# Patient Record
Sex: Female | Born: 1980
Health system: Southern US, Community
[De-identification: ages and names within clinical notes are randomized; demographics above are authoritative.]

## PROBLEM LIST (undated history)

## (undated) DIAGNOSIS — J45909 Unspecified asthma, uncomplicated: Secondary | ICD-10-CM

## (undated) DIAGNOSIS — F909 Attention-deficit hyperactivity disorder, unspecified type: Secondary | ICD-10-CM

## (undated) DIAGNOSIS — F329 Major depressive disorder, single episode, unspecified: Secondary | ICD-10-CM

## (undated) DIAGNOSIS — Z6841 Body Mass Index (BMI) 40.0 and over, adult: Secondary | ICD-10-CM

## (undated) DIAGNOSIS — F32A Depression, unspecified: Secondary | ICD-10-CM

## (undated) DIAGNOSIS — I10 Essential (primary) hypertension: Secondary | ICD-10-CM

## (undated) HISTORY — DX: Major depressive disorder, single episode, unspecified: F32.9

## (undated) HISTORY — DX: Depression, unspecified: F32.A

## (undated) HISTORY — DX: Body Mass Index (BMI) 40.0 and over, adult: Z684

## (undated) HISTORY — DX: Attention-deficit hyperactivity disorder, unspecified type: F90.9

## (undated) HISTORY — DX: Morbid (severe) obesity due to excess calories: E66.01

---
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
## 2005-04-04 ENCOUNTER — Inpatient Hospital Stay (HOSPITAL_COMMUNITY): Admission: AD | Admit: 2005-04-04 | Discharge: 2005-04-04 | Payer: Self-pay | Admitting: *Deleted

## 2005-10-09 HISTORY — PX: CHOLECYSTECTOMY: SHX55

## 2005-10-09 HISTORY — PX: TUBAL LIGATION: SHX77

## 2009-07-07 ENCOUNTER — Emergency Department (HOSPITAL_COMMUNITY): Admission: EM | Admit: 2009-07-07 | Discharge: 2009-07-07 | Payer: Self-pay | Admitting: Emergency Medicine

## 2010-01-13 ENCOUNTER — Emergency Department (HOSPITAL_COMMUNITY): Admission: EM | Admit: 2010-01-13 | Discharge: 2010-01-13 | Payer: Self-pay | Admitting: Emergency Medicine

## 2010-01-25 ENCOUNTER — Emergency Department (HOSPITAL_COMMUNITY): Admission: EM | Admit: 2010-01-25 | Discharge: 2010-01-25 | Payer: Self-pay | Admitting: Emergency Medicine

## 2010-02-16 ENCOUNTER — Emergency Department (HOSPITAL_COMMUNITY): Admission: EM | Admit: 2010-02-16 | Discharge: 2010-02-17 | Payer: Self-pay | Admitting: Emergency Medicine

## 2010-02-20 ENCOUNTER — Emergency Department (HOSPITAL_COMMUNITY): Admission: EM | Admit: 2010-02-20 | Discharge: 2010-02-20 | Payer: Self-pay | Admitting: Emergency Medicine

## 2010-02-28 ENCOUNTER — Ambulatory Visit: Payer: Self-pay | Admitting: Internal Medicine

## 2011-01-13 LAB — URINALYSIS, ROUTINE W REFLEX MICROSCOPIC
Bilirubin Urine: NEGATIVE
Glucose, UA: NEGATIVE mg/dL
Ketones, ur: NEGATIVE mg/dL
Nitrite: NEGATIVE
Protein, ur: NEGATIVE mg/dL
Specific Gravity, Urine: 1.012 (ref 1.005–1.030)
Urobilinogen, UA: 0.2 mg/dL (ref 0.0–1.0)
pH: 6.5 (ref 5.0–8.0)

## 2011-01-13 LAB — URINE MICROSCOPIC-ADD ON

## 2011-01-13 LAB — URINE CULTURE: Colony Count: >=100000

## 2011-01-13 LAB — POCT I-STAT, CHEM 8
BUN: 8 mg/dL (ref 6–23)
Calcium, Ion: 1.09 mmol/L — ABNORMAL LOW (ref 1.12–1.32)
Chloride: 106 meq/L (ref 96–112)
Creatinine, Ser: 1 mg/dL (ref 0.4–1.2)
Glucose, Bld: 95 mg/dL (ref 70–99)
HCT: 35 % — ABNORMAL LOW (ref 36.0–46.0)
Hemoglobin: 11.9 g/dL — ABNORMAL LOW (ref 12.0–15.0)
Potassium: 3.8 meq/L (ref 3.5–5.1)
Sodium: 137 meq/L (ref 135–145)
TCO2: 22 mmol/L (ref 0–100)

## 2011-01-13 LAB — DIFFERENTIAL
Basophils Absolute: 0 K/uL (ref 0.0–0.1)
Basophils Relative: 0 % (ref 0–1)
Eosinophils Absolute: 0.3 K/uL (ref 0.0–0.7)
Eosinophils Relative: 3 % (ref 0–5)
Lymphocytes Relative: 18 % (ref 12–46)
Lymphs Abs: 1.5 K/uL (ref 0.7–4.0)
Monocytes Absolute: 0.5 K/uL (ref 0.1–1.0)
Monocytes Relative: 6 % (ref 3–12)
Neutro Abs: 6.1 K/uL (ref 1.7–7.7)
Neutrophils Relative %: 72 % (ref 43–77)

## 2011-01-13 LAB — CBC
HCT: 34.1 % — ABNORMAL LOW (ref 36.0–46.0)
Hemoglobin: 11.5 g/dL — ABNORMAL LOW (ref 12.0–15.0)
MCHC: 33.7 g/dL (ref 30.0–36.0)
MCV: 87.8 fL (ref 78.0–100.0)
Platelets: 264 K/uL (ref 150–400)
RBC: 3.88 MIL/uL (ref 3.87–5.11)
RDW: 14.6 % (ref 11.5–15.5)
WBC: 8.5 K/uL (ref 4.0–10.5)

## 2011-01-13 LAB — POCT CARDIAC MARKERS
Myoglobin, poc: 48.2 ng/mL (ref 12–200)
Troponin i, poc: <0.05 ng/mL (ref 0.00–0.09)

## 2011-01-13 LAB — PREGNANCY, URINE: Preg Test, Ur: NEGATIVE

## 2011-10-10 HISTORY — PX: KIDNEY STONE SURGERY: SHX686

## 2018-01-04 ENCOUNTER — Other Ambulatory Visit: Payer: Self-pay

## 2018-01-04 ENCOUNTER — Emergency Department (HOSPITAL_COMMUNITY)
Admission: EM | Admit: 2018-01-04 | Discharge: 2018-01-04 | Disposition: A | Discharge status: Home / Self Care | Payer: Self-pay | Attending: Physician Assistant | Admitting: Physician Assistant

## 2018-01-04 ENCOUNTER — Encounter (HOSPITAL_COMMUNITY): Payer: Self-pay

## 2018-01-04 DIAGNOSIS — M79671 Pain in right foot: Secondary | ICD-10-CM | POA: Insufficient documentation

## 2018-01-04 DIAGNOSIS — M79672 Pain in left foot: Secondary | ICD-10-CM | POA: Insufficient documentation

## 2018-01-04 DIAGNOSIS — Z5321 Procedure and treatment not carried out due to patient leaving prior to being seen by health care provider: Secondary | ICD-10-CM | POA: Insufficient documentation

## 2018-01-04 HISTORY — DX: Unspecified asthma, uncomplicated: J45.909

## 2018-01-04 HISTORY — DX: Essential (primary) hypertension: I10

## 2018-01-04 LAB — COMPREHENSIVE METABOLIC PANEL
ALBUMIN: 3.5 g/dL (ref 3.5–5.0)
ALK PHOS: 65 U/L (ref 38–126)
ALT: 29 U/L (ref 14–54)
AST: 23 U/L (ref 15–41)
Anion gap: 8 (ref 5–15)
BUN: 5 mg/dL — ABNORMAL LOW (ref 6–20)
CALCIUM: 8.9 mg/dL (ref 8.9–10.3)
CHLORIDE: 107 mmol/L (ref 101–111)
CO2: 23 mmol/L (ref 22–32)
Creatinine, Ser: 0.69 mg/dL (ref 0.44–1.00)
GFR calc Af Amer: >60 mL/min (ref >60)
GFR calc non Af Amer: >60 mL/min (ref >60)
GLUCOSE: 83 mg/dL (ref 65–99)
Potassium: 3.3 mmol/L — ABNORMAL LOW (ref 3.5–5.1)
SODIUM: 138 mmol/L (ref 135–145)
Total Bilirubin: 0.6 mg/dL (ref 0.3–1.2)
Total Protein: 7 g/dL (ref 6.5–8.1)

## 2018-01-04 LAB — CBC
HCT: 34.4 % — ABNORMAL LOW (ref 36.0–46.0)
HEMOGLOBIN: 10.4 g/dL — AB (ref 12.0–15.0)
MCH: 24 pg — ABNORMAL LOW (ref 26.0–34.0)
MCHC: 30.2 g/dL (ref 30.0–36.0)
MCV: 79.4 fL (ref 78.0–100.0)
PLATELETS: 465 10*3/uL — AB (ref 150–400)
RBC: 4.33 MIL/uL (ref 3.87–5.11)
RDW: 16.9 % — ABNORMAL HIGH (ref 11.5–15.5)
WBC: 6.8 10*3/uL (ref 4.0–10.5)

## 2018-01-04 NOTE — ED Provider Notes (Signed)
Patient placed in Quick Look pathway, seen and evaluated   Chief Complaint: bilateral foot pain  HPI:   Emily Salazar is a 37 y.o. female with hx of HTN who presents to the ED with bilateral foot pain that started 3 days ago. Patient reports working long hours standing. She states that for the past 3 days she has been standing from 8 am till 10:30 pm. Patient wears waterproof boots without much arch support. Patient's BP elevated but she reports not taking her BP medication today.   ROS: M/S: bilateral foot pain  Physical Exam:  BP (!) 191/124 (BP Location: Right Arm)   Pulse 90   Temp 98.7 F (37.1 C) (Oral)   Resp 16   LMP 12/19/2017 (Within Days)   SpO2 100%    Gen: No distress  Neuro: Awake and Alert  Skin: Warm and dry  M/S: tenderness to the plantar aspect of both feet.     Focused Exam:    Initiation of care has begun. The patient has been counseled on the process, plan, and necessity for staying for the completion/evaluation, and the remainder of the medical screening examination    Janne Napoleoneese, Hope M, NP 01/04/18 1448    Abelino DerrickMackuen, Courteney Lyn, MD 01/04/18 2003

## 2018-01-04 NOTE — ED Triage Notes (Signed)
Pt reports bilateral foot pain X3 days. Pt states she has been having to take tylenol and ibuprofen to control the pain while at work. Pt alert and oriented, tearful in triage. States she stands all day at work. Pt denies swelling.

## 2018-01-04 NOTE — ED Notes (Signed)
Pt stated that she did not want to wait any longer and she was leaving

## 2018-03-01 ENCOUNTER — Encounter: Payer: Self-pay | Admitting: Nurse Practitioner

## 2018-03-01 ENCOUNTER — Ambulatory Visit (INDEPENDENT_AMBULATORY_CARE_PROVIDER_SITE_OTHER): Payer: PRIVATE HEALTH INSURANCE

## 2018-03-01 ENCOUNTER — Ambulatory Visit (INDEPENDENT_AMBULATORY_CARE_PROVIDER_SITE_OTHER): Payer: PRIVATE HEALTH INSURANCE | Admitting: Nurse Practitioner

## 2018-03-01 VITALS — BP 178/120 | HR 95 | Temp 99.1°F | Ht 62.0 in | Wt 238.0 lb

## 2018-03-01 DIAGNOSIS — R0602 Shortness of breath: Secondary | ICD-10-CM

## 2018-03-01 DIAGNOSIS — M778 Other enthesopathies, not elsewhere classified: Secondary | ICD-10-CM

## 2018-03-01 DIAGNOSIS — R29898 Other symptoms and signs involving the musculoskeletal system: Secondary | ICD-10-CM | POA: Diagnosis not present

## 2018-03-01 DIAGNOSIS — J9801 Acute bronchospasm: Secondary | ICD-10-CM | POA: Diagnosis not present

## 2018-03-01 DIAGNOSIS — I10 Essential (primary) hypertension: Secondary | ICD-10-CM

## 2018-03-01 DIAGNOSIS — J4541 Moderate persistent asthma with (acute) exacerbation: Secondary | ICD-10-CM | POA: Diagnosis not present

## 2018-03-01 DIAGNOSIS — B351 Tinea unguium: Secondary | ICD-10-CM

## 2018-03-01 MED ORDER — ALBUTEROL SULFATE HFA 108 (90 BASE) MCG/ACT IN AERS: 1.0000 | INHALATION_SPRAY | Freq: Four times a day (QID) | RESPIRATORY_TRACT | 0 refills | Status: DC | PRN

## 2018-03-01 MED ORDER — LISINOPRIL 10 MG PO TABS: 10.0000 mg | ORAL_TABLET | Freq: Every day | ORAL | 1 refills | Status: DC

## 2018-03-01 MED ORDER — IPRATROPIUM-ALBUTEROL 0.5-2.5 (3) MG/3ML IN SOLN
3.0000 mL | Freq: Four times a day (QID) | RESPIRATORY_TRACT | Status: DC
  Administered 2018-03-01: 3 mL via RESPIRATORY_TRACT

## 2018-03-01 MED ORDER — AMLODIPINE BESYLATE 5 MG PO TABS: 5.0000 mg | ORAL_TABLET | Freq: Every day | ORAL | 1 refills | Status: DC

## 2018-03-01 MED ORDER — FLUTICASONE-SALMETEROL 115-21 MCG/ACT IN AERO: 2.0000 | INHALATION_SPRAY | Freq: Two times a day (BID) | RESPIRATORY_TRACT | 5 refills | Status: DC

## 2018-03-01 MED ORDER — MELOXICAM 7.5 MG PO TABS: 7.5000 mg | ORAL_TABLET | Freq: Every day | ORAL | 0 refills | Status: DC

## 2018-03-01 NOTE — Patient Instructions (Addendum)
Normal CXR  Start advair inhaler (rinse mouth after each use)  You will be contacted to schedule appt with hand specialist and podiatry.  Stop use of ASA, naproxen, aleve, ibuprofen or advil.  You can on use tylenol  1tab every 4hrs as needed.

## 2018-03-01 NOTE — Progress Notes (Signed)
Subjective:  Patient ID: Emily Salazar, female    DOB: [birth year]  Age: 37 y.o. MRN: [id number]  CC: Establish Care (est care/both hand numbness and swelling--going on for a while--was on steroid--went to urgent for it--steriod didnt help/rright wrist pain--1 mo--inhalter?)   Wrist Pain   The pain is present in the left wrist and right wrist. This is a chronic problem. The current episode started more than 1 year ago. There has been no history of extremity trauma. The problem occurs constantly. The problem has been gradually worsening. The quality of the pain is described as aching. The pain is at a severity of 9/10. Associated symptoms include joint swelling, a limited range of motion, numbness, stiffness and tingling. Pertinent negatives include no fever, inability to bear weight or joint locking. The symptoms are aggravated by activity. She has tried acetaminophen, OTC ointments, NSAIDS and oral narcotics (and oral prednisone) for the symptoms. The treatment provided mild relief. Her past medical history is significant for osteoarthritis.  Cough  This is a chronic problem. The current episode started more than 1 year ago. The problem has been waxing and waning. The problem occurs constantly. The cough is productive of sputum. Associated symptoms include nasal congestion, postnasal drip, shortness of breath and wheezing. Pertinent negatives include no chest pain, chills, ear congestion, ear pain, fever, headaches, heartburn, hemoptysis, myalgias, rash, rhinorrhea, sore throat, sweats or weight loss. The symptoms are aggravated by lying down, cold air, pollens and fumes. Risk factors for lung disease include smoking/tobacco exposure. She has tried a beta-agonist inhaler and OTC cough suppressant for the symptoms. The treatment provided mild relief. Her past medical history is significant for asthma and bronchitis.  Hypertension  This is a chronic problem. The current episode started more than 1 year ago.  The problem has been gradually worsening since onset. The problem is uncontrolled. Associated symptoms include shortness of breath. Pertinent negatives include no anxiety, blurred vision, chest pain, headaches, orthopnea, palpitations, peripheral edema, PND or sweats. Agents associated with hypertension include NSAIDs. Risk factors for coronary artery disease include family history, obesity and smoking/tobacco exposure. Past treatments include ACE inhibitors, diuretics and calcium channel blockers. The current treatment provides moderate improvement. Compliance problems include psychosocial issues.   out of BP medication since release from prison 07/2017.  Cough x several years Productive (clear) SOB Hx of asthma, uncontrolled Use of albuterol several times a day. Peak flow measurement 290, 240, 297ml/min  Hand and wrist pain and numbness: Chronic, worsening. Job prior to imprisonment: receptionist Use of wrist brace with minimal relief. No relief with tylenol and aleve and tylenol. Diagnosed with carpal tunnel syndrome while in prison.  Outpatient Medications Prior to Visit  Medication Sig Dispense Refill  . Acetaminophen (TYLENOL 8 HOUR PO) Take by mouth.    . IBUPROFEN PO Take by mouth.    . NON FORMULARY OTC for allergy    . ALBUTEROL SULFATE IN Inhale into the lungs.     No facility-administered medications prior to visit.    Reviewed family and social hx with patient.  ROS See HPI  Objective:  BP (!) 178/120   Pulse 95   Temp 99.1 F (37.3 C) (Oral)   Ht  (1.575 m)   Wt 238 lb (108 kg)   SpO2 99%   BMI 43.53 kg/m   BP Readings from Last 3 Encounters:  03/01/18 (!) 178/120  01/04/18 (!) 163/109    Wt Readings from Last 3 Encounters:  03/01/18 238 lb (108  kg)    Physical Exam  Constitutional: She is oriented to person, place, and time. No distress.  HENT:  Right Ear: External ear normal.  Left Ear: External ear normal.  Nose: Nose normal.    Mouth/Throat: Oropharynx is clear and moist. No oropharyngeal exudate.  Neck: Normal range of motion. Neck supple. No thyromegaly present.  Cardiovascular: Normal rate, regular rhythm, normal heart sounds and intact distal pulses.  Pulmonary/Chest: Effort normal. No respiratory distress. She has wheezes. She has rales.  Musculoskeletal: She exhibits tenderness.       Right shoulder: Normal.       Left shoulder: Normal.       Right wrist: She exhibits decreased range of motion, tenderness and swelling.       Left wrist: She exhibits decreased range of motion, tenderness and swelling.       Cervical back: Normal.       Right hand: Normal sensation noted. Decreased strength noted. She exhibits finger abduction, thumb/finger opposition and wrist extension trouble.       Left hand: She exhibits normal range of motion, no tenderness, no laceration and no swelling. Normal sensation noted. Decreased strength noted. She exhibits finger abduction, thumb/finger opposition and wrist extension trouble.  Lymphadenopathy:    She has no cervical adenopathy.  Neurological: She is alert and oriented to person, place, and time.  Skin: Skin is warm and dry.  Toenails: thick and discolored. No erythema or swelling Skin intact  Psychiatric: She has a normal mood and affect. Her behavior is normal. Thought content normal.  Vitals reviewed.   Lab Results  Component Value Date   WBC 6.8 01/04/2018   HGB 10.4 (L) 01/04/2018   HCT 34.4 (L) 01/04/2018   PLT 465 (H) 01/04/2018   GLUCOSE 83 01/04/2018   ALT 29 01/04/2018   AST 23 01/04/2018   NA 138 01/04/2018   K 3.3 (L) 01/04/2018   CL 107 01/04/2018   CREATININE 0.69 01/04/2018   BUN 5 (L) 01/04/2018   CO2 23 01/04/2018   Peak Flow measurements: 290, 240, 263ml/min.  Assessment & Plan:   Emily Salazar was seen today for establish care.  Diagnoses and all orders for this visit:  Moderate persistent asthma with acute exacerbation -     Peak flow  meter -     Discontinue: albuterol (PROVENTIL HFA;VENTOLIN HFA) 108 (90 Base) MCG/ACT inhaler; Inhale 1-2 puffs into the lungs every 6 (six) hours as needed for wheezing or shortness of breath. -     fluticasone-salmeterol (ADVAIR HFA) 115-21 MCG/ACT inhaler; Inhale 2 puffs into the lungs 2 (two) times daily. Rinse mouth after each use -     ipratropium-albuterol (DUONEB) 0.5-2.5 (3) MG/3ML nebulizer solution 3 mL  Cough due to bronchospasm -     ipratropium-albuterol (DUONEB) 0.5-2.5 (3) MG/3ML nebulizer solution 3 mL  SOB (shortness of breath) on exertion -     DG Chest 2 View; Future -     DG Chest 2 View  Toenail fungus -     Ambulatory referral to Podiatry  Tendonitis of both wrists -     Ambulatory referral to Orthopedics -     meloxicam (MOBIC) 7.5 MG tablet; Take 1 tablet (7.5 mg total) by mouth daily. With food  Weakness of both hands -     Ambulatory referral to Orthopedics -     meloxicam (MOBIC) 7.5 MG tablet; Take 1 tablet (7.5 mg total) by mouth daily. With food  Essential hypertension -  amLODipine (NORVASC) 5 MG tablet; Take 1 tablet (5 mg total) by mouth at bedtime. -     lisinopril (PRINIVIL,ZESTRIL) 10 MG tablet; Take 1 tablet (10 mg total) by mouth daily.  Other orders -     albuterol (PROAIR HFA) 108 (90 Base) MCG/ACT inhaler; Inhale 1-2 puffs into the lungs every 6 (six) hours as needed for wheezing or shortness of breath.   I have discontinued Emily Salazar's ALBUTEROL SULFATE IN and albuterol. I am also having her start on fluticasone-salmeterol, meloxicam, amLODipine, lisinopril, and albuterol. Additionally, I am having her maintain her IBUPROFEN PO, Acetaminophen (TYLENOL 8 HOUR PO), and NON FORMULARY. We administered ipratropium-albuterol. We will continue to administer ipratropium-albuterol.  Meds ordered this encounter  Medications  . DISCONTD: albuterol (PROVENTIL HFA;VENTOLIN HFA) 108 (90 Base) MCG/ACT inhaler    Sig: Inhale 1-2 puffs into the lungs  every 6 (six) hours as needed for wheezing or shortness of breath.    Dispense:  1 Inhaler    Refill:  0    Order Specific Question:   Supervising Provider    Answer:   Dianne Dun [[id number]]  . fluticasone-salmeterol (ADVAIR HFA) 115-21 MCG/ACT inhaler    Sig: Inhale 2 puffs into the lungs 2 (two) times daily. Rinse mouth after each use    Dispense:  1 Inhaler    Refill:  5    Order Specific Question:   Supervising Provider    Answer:   Dianne Dun [[id number]]  . meloxicam (MOBIC) 7.5 MG tablet    Sig: Take 1 tablet (7.5 mg total) by mouth daily. With food    Dispense:  30 tablet    Refill:  0    Order Specific Question:   Supervising Provider    Answer:   Dianne Dun [[id number]]  . amLODipine (NORVASC) 5 MG tablet    Sig: Take 1 tablet (5 mg total) by mouth at bedtime.    Dispense:  30 tablet    Refill:  1    Order Specific Question:   Supervising Provider    Answer:   Dianne Dun [[id number]]  . lisinopril (PRINIVIL,ZESTRIL) 10 MG tablet    Sig: Take 1 tablet (10 mg total) by mouth daily.    Dispense:  30 tablet    Refill:  1    Order Specific Question:   Supervising Provider    Answer:   Dianne Dun [[id number]]  . albuterol (PROAIR HFA) 108 (90 Base) MCG/ACT inhaler    Sig: Inhale 1-2 puffs into the lungs every 6 (six) hours as needed for wheezing or shortness of breath.    Dispense:  1 Inhaler    Refill:  0  . ipratropium-albuterol (DUONEB) 0.5-2.5 (3) MG/3ML nebulizer solution 3 mL    Follow-up: Return in about 2 weeks (around 03/15/2018) for HTN.  Alysia Penna, NP

## 2018-03-11 ENCOUNTER — Telehealth: Payer: Self-pay

## 2018-03-11 DIAGNOSIS — M778 Other enthesopathies, not elsewhere classified: Secondary | ICD-10-CM

## 2018-03-11 DIAGNOSIS — R29898 Other symptoms and signs involving the musculoskeletal system: Secondary | ICD-10-CM

## 2018-03-11 NOTE — Telephone Encounter (Signed)
Copied from CRM [id number]. Topic: General - Other >> Mar 11, 2018  4:11 PM Mcneil, Ja-Kwan wrote: Reason for CRM: Pt states she needs something stronger than the meloxicam (MOBIC) 7.5 MG tablet until her appt on 03/18/18 @10 :30 am. Pt stated she still have to take OTC medication to assist with pain relief and she does not go to the hand specialist until 04/01/18 @ 1:30 pm. Cb# [phone]

## 2018-03-11 NOTE — Telephone Encounter (Signed)
Unfortunately, I cannot give her anything stronger than an NSAID (if she is asking for a narcotic) without seeing her.  We could try a higher dose of meloxicam or prescription strength other NSAID like Naprosyn.  Please let me know what she would like to do and I will change rx.

## 2018-03-12 MED ORDER — MELOXICAM 15 MG PO TABS: 15.0000 mg | ORAL_TABLET | Freq: Every day | ORAL | 3 refills | Status: DC

## 2018-03-12 NOTE — Telephone Encounter (Signed)
Higher dose of Meloxicam sent to pharmacy on file.

## 2018-03-12 NOTE — Telephone Encounter (Signed)
Dr. Dayton MartesAron- pt states she will try anything since she is in so much pain. She asked that you decide what would be best and she will try it. She will be here Monday for her appt with you.

## 2018-03-18 ENCOUNTER — Encounter: Payer: Self-pay | Admitting: Podiatry

## 2018-03-18 ENCOUNTER — Ambulatory Visit (INDEPENDENT_AMBULATORY_CARE_PROVIDER_SITE_OTHER): Payer: PRIVATE HEALTH INSURANCE | Admitting: Podiatry

## 2018-03-18 ENCOUNTER — Ambulatory Visit (INDEPENDENT_AMBULATORY_CARE_PROVIDER_SITE_OTHER): Payer: PRIVATE HEALTH INSURANCE | Admitting: Family Medicine

## 2018-03-18 ENCOUNTER — Encounter: Payer: Self-pay | Admitting: Family Medicine

## 2018-03-18 VITALS — BP 145/105 | HR 87 | Resp 16

## 2018-03-18 VITALS — BP 140/110 | HR 82 | Temp 99.1°F | Ht 62.0 in | Wt 237.0 lb

## 2018-03-18 DIAGNOSIS — L600 Ingrowing nail: Secondary | ICD-10-CM | POA: Diagnosis not present

## 2018-03-18 DIAGNOSIS — I10 Essential (primary) hypertension: Secondary | ICD-10-CM

## 2018-03-18 DIAGNOSIS — L309 Dermatitis, unspecified: Secondary | ICD-10-CM | POA: Insufficient documentation

## 2018-03-18 DIAGNOSIS — L308 Other specified dermatitis: Secondary | ICD-10-CM | POA: Diagnosis not present

## 2018-03-18 MED ORDER — TRIAMCINOLONE ACETONIDE 0.1 % EX CREA: 1.0000 "application " | TOPICAL_CREAM | Freq: Two times a day (BID) | CUTANEOUS | 0 refills | Status: AC

## 2018-03-18 MED ORDER — LISINOPRIL 20 MG PO TABS: 20.0000 mg | ORAL_TABLET | Freq: Every day | ORAL | 3 refills | Status: DC

## 2018-03-18 NOTE — Progress Notes (Signed)
   Subjective:    Patient ID: Emily Salazar, female    DOB: [birth year], 37 y.o.   MRN: [id number]  HPI    Review of Systems  All other systems reviewed and are negative.      Objective:   Physical Exam        Assessment & Plan:

## 2018-03-18 NOTE — Assessment & Plan Note (Signed)
Deteriorated. eRx sent for triamcinolone to use as needed. Call or return to clinic prn if these symptoms worsen or fail to improve as anticipated. The patient indicates understanding of these issues and agrees with the plan.

## 2018-03-18 NOTE — Progress Notes (Signed)
Subjective:   Patient ID: Emily Salazar, female   DOB: 37 y.o.   MRN: [id number]   HPI Patient presents stating she is had nail discoloration of both her big toenails for several years and has started to develop quite a bit of pain in the left hallux nail and states that she is tried to trim it and cannot do it and is increasingly tender for her.  Patient does not remember specific injury and smokes half pack per day recently and likes to be active   Review of Systems  All other systems reviewed and are negative.       Objective:  Physical Exam  Constitutional: She appears well-developed and well-nourished.  Cardiovascular: Intact distal pulses.  Pulmonary/Chest: Effort normal.  Musculoskeletal: Normal range of motion.  Neurological: She is alert.  Skin: Skin is warm.  Nursing note and vitals reviewed.   Neurovascular status intact muscle strength is adequate range of motion within normal limits with patient found to have inflammation and discomfort of the hallux nails left over right with incurvation of the medial border and thickness of the underlying nailbed bilateral hallux.  Patient was noted to have good digital perfusion and is well oriented x3     Assessment:  Ingrown toenail deformity left hallux with fungal component and trauma component also present with chronic thickness of the hallux nailbeds     Plan:  H&P and I explained the difference between nail disease trauma and ingrown toenail.  Ingrown toenails very tender and I recommended correction and she wants this done and I explained procedure and risk and allowed her to sign consent form.  I infiltrated the left hallux 60 mg like Marcaine mixture under sterile conditions with sterile instrumentation remove the medial border exposed matrix and applied phenol 3 applications 30 seconds followed by alcohol lavage and sterile dressing.  She is going to pursue oral medication but wants to work with her family physician wants her  blood pressure under better control before he will start her on oral medication for fungus infection but I did also educate her on trauma

## 2018-03-18 NOTE — Assessment & Plan Note (Signed)
Almost at goal. Increase Lisinopril to 20 mg daily, continue norvasc 5 mg daily. Discussed signs and symptoms of hypotension to pt and advised to call us ASAP if she develops these symptoms as she does not have a home BP cuff. Follow up in 2 weeks for BP check. The patient indicates understanding of these issues and agrees with the plan.

## 2018-03-18 NOTE — Progress Notes (Signed)
Subjective:   Patient ID: Emily Salazar, female    DOB: [birth year], 37 y.o.   MRN: [id number]  Emily Salazar is a pleasant 37 y.o. year old female who presents to clinic today with Hypertension (Patient is here today for a 2-week-F/U HTN per PCP.  On 5.24.19 OV she was advised to Restart Amlodipine 5mg  1qd and Lisinopril 10mg  1qd as she had not had access to these medications since 10.2018. She was also told to D/C ASA, Naproxen, Ibuprofen.  She was seen at Podiatry and it was 145/105 today at [facility].  She states that it is improving from where it was but not where it needs to be.  She states that the numbing stuff is starting to wear off from foot procedure which may make BP high) and Follow-up (She was told that she would get some type of sample for her wrists for carpal tunnel to help until she gets to her hand specialist.)  on 03/18/2018  HPI:  Patient is new to me.  Established care with [facility] on 03/01/18. Note reviewed.  Chronic HTN- had been out of her antihypertensives since she was released from prison in 07/2017.  Restarted Norvasc 5 mg daily and Lisinopril 10 mg daily after that OV.  Since that time, she has been to podiatry and while BP has improved, remains elevated.  Asymptomatic- NO HA, blurred vision, CP, SOB or LE edema but was not symptomatic when it was running much higher either.  Still taking Mobic.  She has eczema flares on her arms and legs intermittently.    Current Outpatient Medications on File Prior to Visit  Medication Sig Dispense Refill  . Acetaminophen (TYLENOL 8 HOUR PO) Take by mouth.    Marland Kitchen albuterol (PROAIR HFA) 108 (90 Base) MCG/ACT inhaler Inhale 1-2 puffs into the lungs every 6 (six) hours as needed for wheezing or shortness of breath. 1 Inhaler 0  . amLODipine (NORVASC) 5 MG tablet Take 1 tablet (5 mg total) by mouth at bedtime. 30 tablet 1  . fluticasone-salmeterol (ADVAIR HFA) 115-21 MCG/ACT inhaler Inhale 2 puffs into the lungs 2  (two) times daily. Rinse mouth after each use 1 Inhaler 5  . meloxicam (MOBIC) 15 MG tablet Take 1 tablet (15 mg total) by mouth daily. With food 30 tablet 3  . NON FORMULARY OTC for allergy     No current facility-administered medications on file prior to visit.     Allergies  Allergen Reactions  . Penicillins Swelling    Blister on lips,neck and shoulder. Ask first  . Sulfa Antibiotics Swelling    Past Medical History:  Diagnosis Date  . ADHD    not taking med right now  . Asthma   . Depression    no med taking  . Hypertension     Past Surgical History:  Procedure Laterality Date  . CESAREAN SECTION  2004 and 2007  . CHOLECYSTECTOMY  2007  . KIDNEY STONE SURGERY  2013   2 times  . NOSE SURGERY  1991   broken  . TUBAL LIGATION  2007    Family History  Problem Relation Age of Onset  . Diabetes Mother   . Cancer Maternal Aunt        lung cancer  . Heart disease Maternal Uncle   . Cancer Maternal Grandmother        brain cancer  . Crohn's disease Cousin     Social History   Socioeconomic History  . Marital status:  Single    Spouse name: Not on file  . Number of children: 2  . Years of education: Not on file  . Highest education level: Not on file  Occupational History  . Occupation: assembly line    Comment: [employer]  Social Needs  . Financial resource strain: Not on file  . Food insecurity:    Worry: Not on file    Inability: Not on file  . Transportation needs:    Medical: Not on file    Non-medical: Not on file  Tobacco Use  . Smoking status: Current Every Day Smoker    Packs/day: 0.50  . Smokeless tobacco: Never Used  Substance and Sexual Activity  . Alcohol use: Not Currently    Frequency: Never    Comment: rare  . Drug use: Yes    Frequency: 2.0 times per week    Types: Marijuana  . Sexual activity: Not Currently  Lifestyle  . Physical activity:    Days per week: Not on file    Minutes per session: Not on file  . Stress: Not  on file  Relationships  . Social connections:    Talks on phone: Not on file    Gets together: Not on file    Attends religious service: Not on file    Active member of club or organization: Not on file    Attends meetings of clubs or organizations: Not on file    Relationship status: Not on file  . Intimate partner violence:    Fear of current or ex partner: Not on file    Emotionally abused: Not on file    Physically abused: Not on file    Forced sexual activity: Not on file  Other Topics Concern  . Not on file  Social History Narrative   Home with sister at this time.   Children in [place] (father has custody)    Incarcerated for 357yrs.   Released 07/2017 from federal prison.   The PMH, PSH, Social History, Family History, Medications, and allergies have [facility], and have been updated if relevant.   Review of Systems  Constitutional: Negative.   HENT: Negative.   Eyes: Negative.   Cardiovascular: Negative.   Gastrointestinal: Negative.   Musculoskeletal: Negative.   Skin: Positive for rash.  Neurological: Negative.   Hematological: Negative.   Psychiatric/Behavioral: Negative.   All other systems reviewed and are negative.      Objective:    BP (!) 140/110 (BP Location: Left Arm, Cuff Size: Large)   Pulse 82   Temp 99.1 F (37.3 C) (Oral)   Ht 5\' 2"  (1.575 m)   Wt 237 lb (107.5 kg)   LMP 03/11/2018   SpO2 100%   BMI 43.35 kg/m   BP Readings from Last 3 Encounters:  03/18/18 (!) 140/110  03/18/18 (!) 145/105  03/01/18 (!) 178/120     Physical Exam   General:  Well-developed,well-nourished,in no acute distress; alert,appropriate and cooperative throughout examination Head:  normocephalic and atraumatic.   Eyes:  vision grossly intact, PERRL Ears:  R ear normal and L ear normal externally, TMs clear bilaterally Nose:  no external deformity.   Mouth:  good dentition.   Neck:  No deformities, masses, or tenderness noted. Lungs:  Normal  respiratory effort, chest expands symmetrically. Lungs are clear to auscultation, no crackles or wheezes. Heart:  Normal rate and regular rhythm. S1 and S2 normal without gallop, murmur, click, rub or other extra sounds. Msk:  No  deformity or scoliosis noted of thoracic or lumbar spine.   Extremities:  No clubbing, cyanosis, edema, or deformity noted with normal full range of motion of all joints.   Neurologic:  alert & oriented X3 and gait normal.   Skin:  Dry raised hyperpigemented lesions on her arms Psych:  Cognition and judgment appear intact. Alert and cooperative with normal attention span and concentration. No apparent delusions, illusions, hallucinations       Assessment & Plan:   Essential hypertension  Other eczema No follow-ups on file.

## 2018-03-18 NOTE — Patient Instructions (Signed)

## 2018-03-18 NOTE — Patient Instructions (Signed)
Great to meet you.  We are increasing your lisinopril to 20 mg daily, continue current dose of amlodipine. Come see me in 2 weeks.

## 2018-03-20 ENCOUNTER — Encounter: Payer: Self-pay | Admitting: Podiatry

## 2018-03-20 ENCOUNTER — Encounter: Payer: Self-pay | Admitting: *Deleted

## 2018-03-20 ENCOUNTER — Telehealth: Payer: Self-pay | Admitting: Podiatry

## 2018-03-20 NOTE — Telephone Encounter (Signed)
Patient is still having pain especially when putting on a tennis shoe. She wants to be extended out from work. Please call patient back at [phone]

## 2018-03-20 NOTE — Telephone Encounter (Signed)
Note ready for pt to pick up on 03/21/2018, stating pt is to be out of work until reevaluated at next visit 03/28/2018.

## 2018-03-28 ENCOUNTER — Encounter: Payer: Self-pay | Admitting: Podiatry

## 2018-03-28 ENCOUNTER — Ambulatory Visit (INDEPENDENT_AMBULATORY_CARE_PROVIDER_SITE_OTHER): Payer: PRIVATE HEALTH INSURANCE | Admitting: Podiatry

## 2018-03-28 DIAGNOSIS — B351 Tinea unguium: Secondary | ICD-10-CM

## 2018-03-28 NOTE — Progress Notes (Signed)
Subjective:   Patient ID: Emily Salazar, female   DOB: 37 y.o.   MRN: [id number]   HPI Patient presents stating ingrown is doing well but I get discomfort across the top of the nail and it is thick and discolored   ROS      Objective:  Physical Exam  Patient ingrown toenail site is healing well left with patient found to have thick crusted tissue dorsal left with pain on the nailbed that appears to be more due to fungal infiltration     Assessment:  Probability for distal mycotic nail infection with pain with good healing of the ingrown toenail component     Plan:  Patient is going to start oral medication when she gets her blood pressure under control and at this point I applied padding to take pressure off the fungal component of the digit with the ingrown component healing well

## 2018-04-01 ENCOUNTER — Ambulatory Visit: Payer: PRIVATE HEALTH INSURANCE | Admitting: Family Medicine

## 2018-04-01 ENCOUNTER — Telehealth: Payer: Self-pay

## 2018-04-01 NOTE — Telephone Encounter (Signed)
I LMOVM for pt to RTC/appt at 12:15pm today needs to be changed to another day at her earliest convenience as Dr. Dayton MartesAron has a meeting/I apologized to her on the VM/PEC if pt calls please reschedule this for us/thx dmf

## 2018-04-08 ENCOUNTER — Ambulatory Visit (INDEPENDENT_AMBULATORY_CARE_PROVIDER_SITE_OTHER): Payer: PRIVATE HEALTH INSURANCE | Admitting: Family Medicine

## 2018-04-08 ENCOUNTER — Encounter: Payer: Self-pay | Admitting: Family Medicine

## 2018-04-08 ENCOUNTER — Other Ambulatory Visit: Payer: Self-pay | Admitting: Nurse Practitioner

## 2018-04-08 DIAGNOSIS — M79643 Pain in unspecified hand: Secondary | ICD-10-CM | POA: Insufficient documentation

## 2018-04-08 DIAGNOSIS — M79642 Pain in left hand: Secondary | ICD-10-CM | POA: Diagnosis not present

## 2018-04-08 DIAGNOSIS — M79641 Pain in right hand: Secondary | ICD-10-CM | POA: Diagnosis not present

## 2018-04-08 DIAGNOSIS — I10 Essential (primary) hypertension: Secondary | ICD-10-CM

## 2018-04-08 MED ORDER — GABAPENTIN 300 MG PO CAPS: 300.0000 mg | ORAL_CAPSULE | Freq: Every day | ORAL | 3 refills | Status: DC

## 2018-04-08 MED ORDER — AMLODIPINE BESYLATE 5 MG PO TABS: 5.0000 mg | ORAL_TABLET | Freq: Every day | ORAL | 1 refills | Status: DC

## 2018-04-08 MED ORDER — LISINOPRIL 20 MG PO TABS: 20.0000 mg | ORAL_TABLET | Freq: Every day | ORAL | 3 refills | Status: DC

## 2018-04-08 NOTE — Assessment & Plan Note (Signed)
Improved control with adding Lisinopril. Continue current rxs. Congratulated her on her weight loss- advised looking for signs of orthostasis as she continues to lose weight. The patient indicates understanding of these issues and agrees with the plan.

## 2018-04-08 NOTE — Patient Instructions (Signed)
Great to see you! Great work on your weight loss and your blood pressure.  We are starting gabapentin 300 mg nightly.  Please follow up with Emily Salazar in 1- 2 months.

## 2018-04-08 NOTE — Assessment & Plan Note (Signed)
D/c mobic. Start gabapentin 300 mg nightly.  Discussed side effects of dizziness and sedation. Follow up in 1-2 months with PCP.

## 2018-04-08 NOTE — Progress Notes (Signed)
Subjective:   Patient ID: Emily Salazar, female    DOB: 24-Jan-1981, 37 y.o.   MRN: [id number]  Kalle Bernath is a pleasant 37 y.o. year old female who presents to clinic today with Follow-up (Patient was restarted on her BP meds by [facility] after not having access to them for months.  She came in for a F/U on 6.10.19 and at that time the Lisinopril was increased to 20mg  1qd and continue the Norvasc 5mg  and return in 2 weeks.  She has noticed that she has been sweating a lot during the day as well as night.  She has lost 7 lbs since last visit. She states that she is staying hydrated. ) and Hand Pain (She states that she has been taking Mobic 15mg  1qd and it is no longer helpful for her bilateral hand pain.  A friend gave her Gabapentin 300mg  and she tried it and it worked.  She is asking if she can switch to that for now.)  on 04/08/2018  HPI:  HTN- last month added Lisinopril 20 mg daily to Norvasc 5 mg daily that she was taking.  Since then, BP has been under much better control.  She is eating better and losing weight. Denies any side effects.  Bilateral hand pain- Claris Gower started her on Mobic but she is asking for Gabapentin.  Took her friend's Gabapentin and it worked well.  Current Outpatient Medications on File Prior to Visit  Medication Sig Dispense Refill  . Acetaminophen (TYLENOL 8 HOUR PO) Take by mouth.    Marland Kitchen albuterol (PROAIR HFA) 108 (90 Base) MCG/ACT inhaler Inhale 1-2 puffs into the lungs every 6 (six) hours as needed for wheezing or shortness of breath. 1 Inhaler 0  . meloxicam (MOBIC) 15 MG tablet Take 1 tablet (15 mg total) by mouth daily. With food 30 tablet 3  . NON FORMULARY OTC for allergy    . triamcinolone cream (KENALOG) 0.1 % Apply 1 application topically 2 (two) times daily. 30 g 0   No current facility-administered medications on file prior to visit.     Allergies  Allergen Reactions  . Penicillins Swelling    Blister on lips,neck and shoulder. Ask first  .  Sulfa Antibiotics Swelling    Past Medical History:  Diagnosis Date  . ADHD    not taking med right now  . Asthma   . Depression    no med taking  . Hypertension     Past Surgical History:  Procedure Laterality Date  . CESAREAN SECTION  2004 and 2007  . CHOLECYSTECTOMY  2007  . KIDNEY STONE SURGERY  2013   2 times  . NOSE SURGERY  1991   broken  . TUBAL LIGATION  2007    Family History  Problem Relation Age of Onset  . Diabetes Mother   . Cancer Maternal Aunt        lung cancer  . Heart disease Maternal Uncle   . Cancer Maternal Grandmother        brain cancer  . Crohn's disease Cousin     Social History   Socioeconomic History  . Marital status: Single    Spouse name: Not on file  . Number of children: 2  . Years of education: Not on file  . Highest education level: Not on file  Occupational History  . Occupation: assembly line    Comment: Oncologist  Social Needs  . Financial resource strain: Not on file  . Food insecurity:  Worry: Not on file    Inability: Not on file  . Transportation needs:    Medical: Not on file    Non-medical: Not on file  Tobacco Use  . Smoking status: Current Every Day Smoker    Packs/day: 0.50  . Smokeless tobacco: Never Used  Substance and Sexual Activity  . Alcohol use: Not Currently    Frequency: Never    Comment: rare  . Drug use: Yes    Frequency: 2.0 times per week    Types: Marijuana  . Sexual activity: Not Currently  Lifestyle  . Physical activity:    Days per week: Not on file    Minutes per session: Not on file  . Stress: Not on file  Relationships  . Social connections:    Talks on phone: Not on file    Gets together: Not on file    Attends religious service: Not on file    Active member of club or organization: Not on file    Attends meetings of clubs or organizations: Not on file    Relationship status: Not on file  . Intimate partner violence:    Fear of current or ex partner: Not on file      Emotionally abused: Not on file    Physically abused: Not on file    Forced sexual activity: Not on file  Other Topics Concern  . Not on file  Social History Narrative   Home with sister at this time.   Children in [place] (father has custody)    Incarcerated for 7161yrs.   Released 07/2017 from [facility] prison.   The PMH, PSH, Social History, Family History, Medications, and [facility], and have been updated if relevant.   Review of Systems  Eyes: Negative.   Respiratory: Negative.   Cardiovascular: Negative.   Musculoskeletal: Positive for arthralgias.  Hematological: Negative.   Psychiatric/Behavioral: Negative.   All other systems reviewed and are negative.      Objective:    BP 128/88 (BP Location: Left Arm, Cuff Size: Normal)   Pulse (!) 107   Temp 99 F (37.2 C) (Oral)   Ht 5\' 2"  (1.575 m)   Wt 230 lb 12.8 oz (104.7 kg)   LMP 03/11/2018   SpO2 99%   BMI 42.21 kg/m    Physical Exam    General:  Well-developed,well-nourished,in no acute distress; alert,appropriate and cooperative throughout examination Head:  normocephalic and atraumatic.   Eyes:  vision grossly intact, PERRL Ears:  R ear normal and L ear normal externally, TMs clear bilaterally Nose:  no external deformity.   Mouth:  good dentition.   Neck:  No deformities, masses, or tenderness noted. Lungs:  Normal respiratory effort, chest expands symmetrically. Lungs are clear to auscultation, no crackles or wheezes. Heart:  Normal rate and regular rhythm. S1 and S2 normal without gallop, murmur, click, rub or other extra sounds. Msk:  No deformity or scoliosis noted of thoracic or lumbar spine.   Extremities:  No clubbing, cyanosis, edema, or deformity noted with normal full range of motion of all joints.   Neurologic:  alert & oriented X3 and gait normal.   Skin:  Intact without suspicious lesions or rashes Psych:  Cognition and judgment appear intact. Alert and cooperative with  normal attention span and concentration. No apparent delusions, illusions, hallucinations      Assessment & Plan:   Essential hypertension - Plan: amLODipine (NORVASC) 5 MG tablet  Pain in both hands No  follow-ups on file.

## 2018-04-09 MED ORDER — ALBUTEROL SULFATE HFA 108 (90 BASE) MCG/ACT IN AERS: 1.0000 | INHALATION_SPRAY | Freq: Four times a day (QID) | RESPIRATORY_TRACT | 0 refills | Status: DC | PRN

## 2018-05-03 ENCOUNTER — Encounter: Payer: Self-pay | Admitting: Nurse Practitioner

## 2018-05-14 ENCOUNTER — Encounter: Payer: Self-pay | Admitting: Nurse Practitioner

## 2018-05-20 ENCOUNTER — Encounter: Payer: Self-pay | Admitting: Nurse Practitioner

## 2018-05-20 ENCOUNTER — Ambulatory Visit (INDEPENDENT_AMBULATORY_CARE_PROVIDER_SITE_OTHER): Payer: PRIVATE HEALTH INSURANCE | Admitting: Nurse Practitioner

## 2018-05-20 VITALS — BP 146/106 | HR 100 | Temp 99.1°F | Ht 62.0 in | Wt 232.0 lb

## 2018-05-20 DIAGNOSIS — R059 Cough, unspecified: Secondary | ICD-10-CM

## 2018-05-20 DIAGNOSIS — Z6841 Body Mass Index (BMI) 40.0 and over, adult: Secondary | ICD-10-CM | POA: Diagnosis not present

## 2018-05-20 DIAGNOSIS — I10 Essential (primary) hypertension: Secondary | ICD-10-CM

## 2018-05-20 DIAGNOSIS — J4541 Moderate persistent asthma with (acute) exacerbation: Secondary | ICD-10-CM

## 2018-05-20 DIAGNOSIS — R05 Cough: Secondary | ICD-10-CM | POA: Diagnosis not present

## 2018-05-20 MED ORDER — IPRATROPIUM-ALBUTEROL 20-100 MCG/ACT IN AERS: 1.0000 | INHALATION_SPRAY | Freq: Four times a day (QID) | RESPIRATORY_TRACT | 1 refills | Status: DC

## 2018-05-20 MED ORDER — RANITIDINE HCL 150 MG PO TABS: 150.0000 mg | ORAL_TABLET | Freq: Two times a day (BID) | ORAL | 0 refills | Status: DC

## 2018-05-20 NOTE — Patient Instructions (Addendum)
Start combivent inhaler every 6hrs. You will be contacted to schedule appt with pulmonology.  Please call [facility] for assistant with diet. [phone]. I will hold on prescribing any weight loss tablet at this time due to uncontrolled BP.  Check BP at home 3x/week and record. Bring BP readings to next office visit.  Normal lab results. Continue current BP medications. Make changes to diet as discussed. F/up in 4weeks to re assess cough and HTN  Calorie Counting for Weight Loss Calories are units of energy. Your body needs a certain amount of calories from food to keep you going throughout the day. When you eat more calories than your body needs, your body stores the extra calories as fat. When you eat fewer calories than your body needs, your body burns fat to get the energy it needs. Calorie counting means keeping track of how many calories you eat and drink each day. Calorie counting can be helpful if you need to lose weight. If you make sure to eat fewer calories than your body needs, you should lose weight. Ask your health care provider what a healthy weight is for you. For calorie counting to work, you will need to eat the right number of calories in a day in order to lose a healthy amount of weight per week. A dietitian can help you determine how many calories you need in a day and will give you suggestions on how to reach your calorie goal.  A healthy amount of weight to lose per week is usually 1-2 lb (0.5-0.9 kg). This usually means that your daily calorie intake should be reduced by 500-750 calories.  Eating 1,200 - 1,500 calories per day can help most women lose weight.  Eating 1,500 - 1,800 calories per day can help most men lose weight.  What is my plan? My goal is to have __________ calories per day. If I have this many calories per day, I should lose around __________ pounds per week. What do I need to know about calorie counting? In order to meet your  daily calorie goal, you will need to:  Find out how many calories are in each food you would like to eat. Try to do this before you eat.  Decide how much of the food you plan to eat.  Write down what you ate and how many calories it had. Doing this is called keeping a food log.  To successfully lose weight, it is important to balance calorie counting with a healthy lifestyle that includes regular activity. Aim for 150 minutes of moderate exercise (such as walking) or 75 minutes of vigorous exercise (such as running) each week. Where do I find calorie information?  The number of calories in a food can be found on a Nutrition Facts label. If a food does not have a Nutrition Facts label, try to look up the calories online or ask your dietitian for help. Remember that calories are listed per serving. If you choose to have more than one serving of a food, you will have to multiply the calories per serving by the amount of servings you plan to eat. For example, the label on a package of bread might say that a serving size is 1 slice and that there are 90 calories in a serving. If you eat 1 slice, you will have eaten 90 calories. If you eat 2 slices, you will have eaten 180 calories. How do I keep a food log? Immediately after each  meal, record the following information in your food log:  What you ate. Don't forget to include toppings, sauces, and other extras on the food.  How much you ate. This can be measured in cups, ounces, or number of items.  How many calories each food and drink had.  The total number of calories in the meal.  Keep your food log near you, such as in a small notebook in your pocket, or use a mobile app or website. Some programs will calculate calories for you and show you how many calories you have left for the day to meet your goal. What are some calorie counting tips?  Use your calories on foods and drinks that will fill you up and not leave you hungry: ? Some examples  of foods that fill you up are nuts and nut butters, vegetables, lean proteins, and high-fiber foods like whole grains. High-fiber foods are foods with more than 5 g fiber per serving. ? Drinks such as sodas, specialty coffee drinks, alcohol, and juices have a lot of calories, yet do not fill you up.  Eat nutritious foods and avoid empty calories. Empty calories are calories you get from foods or beverages that do not have many vitamins or protein, such as candy, sweets, and soda. It is better to have a nutritious high-calorie food (such as an avocado) than a food with few nutrients (such as a bag of chips).  Know how many calories are in the foods you eat most often. This will help you calculate calorie counts faster.  Pay attention to calories in drinks. Low-calorie drinks include water and unsweetened drinks.  Pay attention to nutrition labels for "low fat" or "fat free" foods. These foods sometimes have the same amount of calories or more calories than the full fat versions. They also often have added sugar, starch, or salt, to make up for flavor that was removed with the fat.  Find a way of tracking calories that works for you. Get creative. Try different apps or programs if writing down calories does not work for you. What are some portion control tips?  Know how many calories are in a serving. This will help you know how many servings of a certain food you can have.  Use a measuring cup to measure serving sizes. You could also try weighing out portions on a kitchen scale. With time, you will be able to estimate serving sizes for some foods.  Take some time to put servings of different foods on your favorite plates, bowls, and cups so you know what a serving looks like.  Try not to eat straight from a bag or box. Doing this can lead to overeating. Put the amount you would like to eat in a cup or on a plate to make sure you are eating the right portion.  Use smaller plates, glasses, and  bowls to prevent overeating.  Try not to multitask (for example, watch TV or use your computer) while eating. If it is time to eat, sit down at a table and enjoy your food. This will help you to know when you are full. It will also help you to be aware of what you are eating and how much you are eating. What are tips for following this plan? Reading food labels  Check the calorie count compared to the serving size. The serving size may be smaller than what you are used to eating.  Check the source of the calories. Make sure the food you  are eating is high in vitamins and protein and low in saturated and trans fats. Shopping  Read nutrition labels while you shop. This will help you make healthy decisions before you decide to purchase your food.  Make a grocery list and stick to it. Cooking  Try to cook your favorite foods in a healthier way. For example, try baking instead of frying.  Use low-fat dairy products. Meal planning  Use more fruits and vegetables. Half of your plate should be fruits and vegetables.  Include lean proteins like poultry and fish. How do I count calories when eating out?  Ask for smaller portion sizes.  Consider sharing an entree and sides instead of getting your own entree.  If you get your own entree, eat only half. Ask for a box at the beginning of your meal and put the rest of your entree in it so you are not tempted to eat it.  If calories are listed on the menu, choose the lower calorie options.  Choose dishes that include vegetables, fruits, whole grains, low-fat dairy products, and lean protein.  Choose items that are boiled, broiled, grilled, or steamed. Stay away from items that are buttered, battered, fried, or served with cream sauce. Items labeled "crispy" are usually fried, unless stated otherwise.  Choose water, low-fat milk, unsweetened iced tea, or other drinks without added sugar. If you want an alcoholic beverage, choose a lower calorie  option such as a glass of wine or light beer.  Ask for dressings, sauces, and syrups on the side. These are usually high in calories, so you should limit the amount you eat.  If you want a salad, choose a garden salad and ask for grilled meats. Avoid extra toppings like bacon, cheese, or fried items. Ask for the dressing on the side, or ask for olive oil and vinegar or lemon to use as dressing.  Estimate how many servings of a food you are given. For example, a serving of cooked rice is  cup or about the size of half a baseball. Knowing serving sizes will help you be aware of how much food you are eating at restaurants. The list below tells you how big or small some common portion sizes are based on everyday objects: ? 1 oz-4 stacked dice. ? 3 oz-1 deck of cards. ? 1 tsp-1 die. ? 1 Tbsp- a ping-pong ball. ? 2 Tbsp-1 ping-pong ball. ?  cup- baseball. ? 1 cup-1 baseball. Summary  Calorie counting means keeping track of how many calories you eat and drink each day. If you eat fewer calories than your body needs, you should lose weight.  A healthy amount of weight to lose per week is usually 1-2 lb (0.5-0.9 kg). This usually means reducing your daily calorie intake by 500-750 calories.  The number of calories in a food can be found on a Nutrition Facts label. If a food does not have a Nutrition Facts label, try to look up the calories online or ask your dietitian for help.  Use your calories on foods and drinks that will fill you up, and not on foods and drinks that will leave you hungry.  Use smaller plates, glasses, and bowls to prevent overeating. This information is not intended to replace advice given to you by your health care provider. Make sure you discuss any questions you have with your health care provider. Document Released: 09/25/2005 Document Revised: 08/25/2016 Document Reviewed: 08/25/2016 Elsevier Interactive Patient Education  [employer].

## 2018-05-20 NOTE — Progress Notes (Signed)
Subjective:  Patient ID: Emily Salazar, female    DOB: 1981-03-24  Age: 37 y.o. MRN: [id number]  CC: Follow-up (6 wk follow up/ hand is good with gabapentin Sun-Thru/do not check BP at home. nebulizer consult?) and Weight Loss (weight loss consult. patient do not work out, pt stated she pretty much work out at work by moving all day and lifting 50lb. Diet: dont eat after 6 pm,no soda. )  Cough  This is a chronic problem. The current episode started more than 1 year ago. The problem has been waxing and waning. The cough is non-productive. Associated symptoms include a sore throat, shortness of breath and wheezing. Pertinent negatives include no chest pain, chills, fever, nasal congestion, postnasal drip or rhinorrhea. The symptoms are aggravated by lying down and fumes. Risk factors for lung disease include smoking/tobacco exposure. She has tried a beta-agonist inhaler and OTC cough suppressant for the symptoms. The treatment provided no relief. Her past medical history is significant for asthma and bronchitis.  peak flow done 02/2018: 290, 240, 27190ml/min. CXR done 02/2018: normal use of albuterol 6-10times a day. Cough worse at bedtime. Stopped advair due to hoarseness, symptom did not improve even with discontinuation of inhaler. Smoke 1/2ppd (cigarette)  Obesity: BMI 42.4 Diet changes:Low fat, eating 1meal per day Drinks 3cans of Redbull energy drink per day. No exercise. Wt Readings from Last 3 Encounters:  05/20/18 232 lb (105.2 kg)  04/08/18 230 lb 12.8 oz (104.7 kg)  03/18/18 237 lb (107.5 kg)   HTN: Waxing and waning. Current use of lisinopril and amlodipine. Does not check BP at home. No change in sodium intake. BP Readings from Last 3 Encounters:  05/20/18 (!) 146/106  04/08/18 128/88  03/18/18 (!) 140/110   Reviewed past Medical, Social and Family history today.  Outpatient Medications Prior to Visit  Medication Sig Dispense Refill  . Acetaminophen (TYLENOL 8 HOUR PO)  Take by mouth.    Marland Kitchen. albuterol (PROAIR HFA) 108 (90 Base) MCG/ACT inhaler Inhale 1-2 puffs into the lungs every 6 (six) hours as needed for wheezing or shortness of breath. 1 Inhaler 0  . gabapentin (NEURONTIN) 300 MG capsule Take 1 capsule (300 mg total) by mouth at bedtime. 30 capsule 3  . lisinopril (PRINIVIL,ZESTRIL) 20 MG tablet Take 1 tablet (20 mg total) by mouth daily. 90 tablet 3  . NON FORMULARY OTC for allergy    . triamcinolone cream (KENALOG) 0.1 % Apply 1 application topically 2 (two) times daily. 30 g 0  . amLODipine (NORVASC) 5 MG tablet Take 1 tablet (5 mg total) by mouth at bedtime. 30 tablet 1   No facility-administered medications prior to visit.     ROS See HPI  Objective:  BP (!) 146/106   Pulse 100   Temp 99.1 F (37.3 C) (Oral)   Ht 5\' 2"  (1.575 m)   Wt 232 lb (105.2 kg)   LMP 05/03/2018   SpO2 97%   BMI 42.43 kg/m   BP Readings from Last 3 Encounters:  05/20/18 (!) 146/106  04/08/18 128/88  03/18/18 (!) 140/110    Wt Readings from Last 3 Encounters:  05/20/18 232 lb (105.2 kg)  04/08/18 230 lb 12.8 oz (104.7 kg)  03/18/18 237 lb (107.5 kg)    Physical Exam  Constitutional: She is oriented to person, place, and time.  Cardiovascular: Normal rate.  Pulmonary/Chest: Effort normal.  Musculoskeletal: Normal range of motion.  Neurological: She is alert and oriented to person, place, and time.  Vitals  reviewed.   Lab Results  Component Value Date   WBC 6.8 01/04/2018   HGB 10.4 (L) 01/04/2018   HCT 34.4 (L) 01/04/2018   PLT 465 (H) 01/04/2018   GLUCOSE 84 05/20/2018   ALT 29 01/04/2018   AST 23 01/04/2018   NA 139 05/20/2018   K 3.7 05/20/2018   CL 108 05/20/2018   CREATININE 0.86 05/20/2018   BUN 9 05/20/2018   CO2 25 05/20/2018   TSH 0.68 05/20/2018   HGBA1C 5.7 05/20/2018    Assessment & Plan:   Irene LimboKizzy was seen today for follow-up and weight loss.  Diagnoses and all orders for this visit:  Essential hypertension -     Basic  metabolic panel -     amLODipine (NORVASC) 5 MG tablet; Take 1 tablet (5 mg total) by mouth at bedtime.  Cough -     Ambulatory referral to Pulmonology -     ranitidine (ZANTAC) 150 MG tablet; Take 1 tablet (150 mg total) by mouth 2 (two) times daily.  Class 3 severe obesity without serious comorbidity with body mass index (BMI) of 40.0 to 44.9 in adult, unspecified obesity type (HCC) -     TSH -     Hemoglobin A1c  Moderate persistent asthma with acute exacerbation -     Ipratropium-Albuterol (COMBIVENT) 20-100 MCG/ACT AERS respimat; Inhale 1 puff into the lungs every 6 (six) hours.   I am having Suly Petrakis start on ranitidine and Ipratropium-Albuterol. I am also having her maintain her Acetaminophen (TYLENOL 8 HOUR PO), NON FORMULARY, triamcinolone cream, gabapentin, lisinopril, albuterol, and amLODipine.  Meds ordered this encounter  Medications  . ranitidine (ZANTAC) 150 MG tablet    Sig: Take 1 tablet (150 mg total) by mouth 2 (two) times daily.    Dispense:  60 tablet    Refill:  0    Order Specific Question:   Supervising Provider    Answer:   MATTHEWS, CODY [[id number]]  . Ipratropium-Albuterol (COMBIVENT) 20-100 MCG/ACT AERS respimat    Sig: Inhale 1 puff into the lungs every 6 (six) hours.    Dispense:  1 Inhaler    Refill:  1    Order Specific Question:   Supervising Provider    Answer:   MATTHEWS, CODY [[id number]]  . amLODipine (NORVASC) 5 MG tablet    Sig: Take 1 tablet (5 mg total) by mouth at bedtime.    Dispense:  90 tablet    Refill:  3    Order Specific Question:   Supervising Provider    Answer:   Clare GandySCHMITZ, JEREMY E [[id number]]    Follow-up: Return in about 4 weeks (around 06/17/2018) for HTN and weight loss.  Alysia Pennaharlotte Nche, NP

## 2018-05-21 ENCOUNTER — Encounter: Payer: Self-pay | Admitting: Nurse Practitioner

## 2018-05-21 LAB — BASIC METABOLIC PANEL
BUN: 9 mg/dL (ref 6–23)
CALCIUM: 9.2 mg/dL (ref 8.4–10.5)
CO2: 25 mEq/L (ref 19–32)
Chloride: 108 mEq/L (ref 96–112)
Creatinine, Ser: 0.86 mg/dL (ref 0.40–1.20)
GFR: 94.19 mL/min (ref >60.00)
GLUCOSE: 84 mg/dL (ref 70–99)
POTASSIUM: 3.7 meq/L (ref 3.5–5.1)
SODIUM: 139 meq/L (ref 135–145)

## 2018-05-21 LAB — TSH: TSH: 0.68 u[IU]/mL (ref 0.35–4.50)

## 2018-05-21 LAB — HEMOGLOBIN A1C: Hgb A1c MFr Bld: 5.7 % (ref 4.6–6.5)

## 2018-05-21 MED ORDER — AMLODIPINE BESYLATE 5 MG PO TABS: 5.0000 mg | ORAL_TABLET | Freq: Every day | ORAL | 3 refills | Status: DC

## 2018-06-21 ENCOUNTER — Institutional Professional Consult (permissible substitution): Payer: PRIVATE HEALTH INSURANCE | Admitting: Emergency Medicine

## 2018-06-21 ENCOUNTER — Ambulatory Visit: Payer: PRIVATE HEALTH INSURANCE | Admitting: Nurse Practitioner

## 2018-06-25 ENCOUNTER — Ambulatory Visit (INDEPENDENT_AMBULATORY_CARE_PROVIDER_SITE_OTHER): Payer: PRIVATE HEALTH INSURANCE

## 2018-06-25 ENCOUNTER — Ambulatory Visit (INDEPENDENT_AMBULATORY_CARE_PROVIDER_SITE_OTHER): Payer: PRIVATE HEALTH INSURANCE | Admitting: Nurse Practitioner

## 2018-06-25 ENCOUNTER — Encounter: Payer: Self-pay | Admitting: Nurse Practitioner

## 2018-06-25 VITALS — BP 124/84 | HR 103 | Temp 99.6°F | Ht 62.0 in | Wt 225.0 lb

## 2018-06-25 DIAGNOSIS — J309 Allergic rhinitis, unspecified: Secondary | ICD-10-CM

## 2018-06-25 DIAGNOSIS — R05 Cough: Secondary | ICD-10-CM

## 2018-06-25 DIAGNOSIS — J9801 Acute bronchospasm: Secondary | ICD-10-CM | POA: Diagnosis not present

## 2018-06-25 DIAGNOSIS — R053 Chronic cough: Secondary | ICD-10-CM | POA: Insufficient documentation

## 2018-06-25 MED ORDER — MONTELUKAST SODIUM 10 MG PO TABS: 10.0000 mg | ORAL_TABLET | Freq: Every day | ORAL | 3 refills | Status: DC

## 2018-06-25 MED ORDER — IPRATROPIUM-ALBUTEROL 0.5-2.5 (3) MG/3ML IN SOLN
3.0000 mL | Freq: Four times a day (QID) | RESPIRATORY_TRACT | Status: AC
  Administered 2018-06-25: 3 mL via RESPIRATORY_TRACT

## 2018-06-25 MED ORDER — FLUTICASONE PROPIONATE 50 MCG/ACT NA SUSP: 2.0000 | Freq: Every day | NASAL | 0 refills | Status: DC

## 2018-06-25 MED ORDER — METHYLPREDNISOLONE ACETATE 40 MG/ML IJ SUSP
40.0000 mg | Freq: Once | INTRAMUSCULAR | Status: AC
  Administered 2018-06-25: 40 mg via INTRAMUSCULAR

## 2018-06-25 MED ORDER — GUAIFENESIN ER 600 MG PO TB12: 600.0000 mg | ORAL_TABLET | Freq: Two times a day (BID) | ORAL | 0 refills | Status: DC | PRN

## 2018-06-25 MED ORDER — METHYLPREDNISOLONE ACETATE 40 MG/ML IJ SUSP: 40.0000 mg | Freq: Once | INTRAMUSCULAR | Status: DC

## 2018-06-25 MED ORDER — CETIRIZINE HCL 10 MG PO TABS: 10.0000 mg | ORAL_TABLET | Freq: Every day | ORAL | 3 refills | Status: DC

## 2018-06-25 MED ORDER — BENZONATATE 100 MG PO CAPS: 100.0000 mg | ORAL_CAPSULE | Freq: Three times a day (TID) | ORAL | 0 refills | Status: DC | PRN

## 2018-06-25 MED ORDER — FEXOFENADINE HCL 180 MG PO TABS: 180.0000 mg | ORAL_TABLET | Freq: Every day | ORAL | Status: DC

## 2018-06-25 MED ORDER — UMECLIDINIUM-VILANTEROL 62.5-25 MCG/INH IN AEPB: 1.0000 | INHALATION_SPRAY | Freq: Every day | RESPIRATORY_TRACT | 1 refills | Status: DC

## 2018-06-25 NOTE — Patient Instructions (Addendum)
Contact pulmonology to reschedule appt [phone].  Normal CXR.  Avoid any decongestant. Stop combivent and allegra. I strongly recommend to stop tobacco use.  Start zrytec, flonase and montelukast for chronic allergic rhinitis. Continue ranitidine.  Use albuterol as prescribed.  Acute Bronchitis, Adult Acute bronchitis is when air tubes (bronchi) in the lungs suddenly get swollen. The condition can make it hard to breathe. It can also cause these symptoms:  A cough.  Coughing up clear, yellow, or green mucus.  Wheezing.  Chest congestion.  Shortness of breath.  A fever.  Body aches.  Chills.  A sore throat.  Follow these instructions at home: Medicines  Take over-the-counter and prescription medicines only as told by your doctor.  If you were prescribed an antibiotic medicine, take it as told by your doctor. Do not stop taking the antibiotic even if you start to feel better. General instructions  Rest.  Drink enough fluids to keep your pee (urine) clear or pale yellow.  Avoid smoking and secondhand smoke. If you smoke and you need help quitting, ask your doctor. Quitting will help your lungs heal faster.  Use an inhaler, cool mist vaporizer, or humidifier as told by your doctor.  Keep all follow-up visits as told by your doctor. This is important. How is this prevented? To lower your risk of getting this condition again:  Wash your hands often with soap and water. If you cannot use soap and water, use hand sanitizer.  Avoid contact with people who have cold symptoms.  Try not to touch your hands to your mouth, nose, or eyes.  Make sure to get the flu shot every year.  Contact a doctor if:  Your symptoms do not get better in 2 weeks. Get help right away if:  You cough up blood.  You have chest pain.  You have very bad shortness of breath.  You become dehydrated.  You faint (pass out) or keep feeling like you are going to pass out.  You  keep throwing up (vomiting).  You have a very bad headache.  Your fever or chills gets worse. This information is not intended to replace advice given to you by your health care provider. Make sure you discuss any questions you have with your health care provider. Document Released: 03/13/2008 Document Revised: 05/03/2016 Document Reviewed: 03/15/2016 Elsevier Interactive Patient Education  [employer]2018 [employer] Inc.

## 2018-06-25 NOTE — Assessment & Plan Note (Addendum)
Chronic, ongoing for over 1.5year, waxing and waning, worse in last 5days. Previously nonproductive, now productive with yellow sputum. Mild relief with alternating between combivent and proair.  Persistent tobacco use. Reports Hx of asthma, unable to tolerate advair (caused hoarseness and dysphagia). Reports hx of chronic allergic rhinitis and sinusitis, no change with use of allegra and decongestant.  Do not think cough is due to lisinopril. Cough was ongoing prior to initiating lisinopril. Lisinopril was initiated 03/01/2018, and dose increased 03/2018. Use of ranitidine BID did not make a difference.  Today's symptoms are acute on chronic. I suspect chronic bronchitis vs asthma vs post nasal drip vs GERD? Will repeat CXR, administer depo 40mg  IM start flonase, zrytec, and montelukast. Maintain ranitidine. Advised to make appt with pulmonology ASAP.

## 2018-06-25 NOTE — Progress Notes (Signed)
Subjective:  Patient ID: Emily Salazar, female    DOB: September 22, 1981  Age: 37 y.o. MRN: [id number]  CC: Cough (patient is complaining of coughing light yellow mucus,chest hurt when cough,sinus,stuffy nose/going on 5 days/ took otc. zpak and benzonatate worked in the past. Zantac consult.)  HPI  Cough: Chronic, ongoing for over 1.5year, waxing and waning, worse in last 5days. Previously nonproductive, now productive with yellow sputum. Mild relief with alternating between combivent and proair.  Persistent tobacco use. Reports Hx of asthma, unable to tolerate advair (caused hoarseness and dysphagia). Reports hx of chronic allergic rhinitis and sinusitis, no change with use of allegra and decongestant. appt with pulmonology was schedule 06/21/18, did not make appt.  Do not think cough is due to lisinopril. Cough was ongoing prior to initiating lisinopril. Lisinopril was initiated 03/01/2018, and dose increased 03/2018. Use of ranitidine BID did not make a difference.  Reviewed past Medical, Social and Family history today.  Outpatient Medications Prior to Visit  Medication Sig Dispense Refill  . Acetaminophen (TYLENOL 8 HOUR PO) Take by mouth.    Marland Kitchen albuterol (PROAIR HFA) 108 (90 Base) MCG/ACT inhaler Inhale 1-2 puffs into the lungs every 6 (six) hours as needed for wheezing or shortness of breath. 1 Inhaler 0  . amLODipine (NORVASC) 5 MG tablet Take 1 tablet (5 mg total) by mouth at bedtime. 90 tablet 3  . gabapentin (NEURONTIN) 300 MG capsule Take 1 capsule (300 mg total) by mouth at bedtime. 30 capsule 3  . lisinopril (PRINIVIL,ZESTRIL) 20 MG tablet Take 1 tablet (20 mg total) by mouth daily. 90 tablet 3  . NON FORMULARY OTC for allergy    . triamcinolone cream (KENALOG) 0.1 % Apply 1 application topically 2 (two) times daily. 30 g 0  . Ipratropium-Albuterol (COMBIVENT) 20-100 MCG/ACT AERS respimat Inhale 1 puff into the lungs every 6 (six) hours. 1 Inhaler 1  . ranitidine (ZANTAC) 150 MG  tablet Take 1 tablet (150 mg total) by mouth 2 (two) times daily. (Patient not taking: Reported on 06/25/2018) 60 tablet 0   No facility-administered medications prior to visit.     ROS See HPI  Objective:  BP 124/84   Pulse (!) 103   Temp 99.6 F (37.6 C) (Oral)   Ht 5\' 2"  (1.575 m)   Wt 225 lb (102.1 kg)   SpO2 98%   BMI 41.15 kg/m   BP Readings from Last 3 Encounters:  06/25/18 124/84  05/20/18 (!) 146/106  04/08/18 128/88    Wt Readings from Last 3 Encounters:  06/25/18 225 lb (102.1 kg)  05/20/18 232 lb (105.2 kg)  04/08/18 230 lb 12.8 oz (104.7 kg)    Physical Exam  Constitutional: Emily Salazar is oriented to person, place, and time.  HENT:  Nose: Mucosal edema and rhinorrhea present. Right sinus exhibits no maxillary sinus tenderness and no frontal sinus tenderness. Left sinus exhibits no maxillary sinus tenderness and no frontal sinus tenderness.  Mouth/Throat: Uvula is midline. Posterior oropharyngeal erythema present. No oropharyngeal exudate or posterior oropharyngeal edema. Tonsils are 2+ on the right. Tonsils are 2+ on the left.  Cardiovascular: Normal rate, regular rhythm and normal heart sounds.  Pulmonary/Chest: Effort normal. Emily Salazar has wheezes. Emily Salazar has rales. Emily Salazar exhibits tenderness.  Musculoskeletal: Emily Salazar exhibits no edema.  Neurological: Emily Salazar is alert and oriented to person, place, and time.  Psychiatric: Emily Salazar has a normal mood and affect. Emily Salazar behavior is normal. Thought content normal.  Vitals reviewed.   Lab Results  Component Value Date  WBC 6.8 01/04/2018   HGB 10.4 (L) 01/04/2018   HCT 34.4 (L) 01/04/2018   PLT 465 (H) 01/04/2018   GLUCOSE 84 05/20/2018   ALT 29 01/04/2018   AST 23 01/04/2018   NA 139 05/20/2018   K 3.7 05/20/2018   CL 108 05/20/2018   CREATININE 0.86 05/20/2018   BUN 9 05/20/2018   CO2 25 05/20/2018   TSH 0.68 05/20/2018   HGBA1C 5.7 05/20/2018    Assessment & Plan:   Emily Salazar was seen today for cough.  Diagnoses and all  orders for this visit:  Acute bronchospasm -     ipratropium-albuterol (DUONEB) 0.5-2.5 (3) MG/3ML nebulizer solution 3 mL -     Discontinue: methylPREDNISolone acetate (DEPO-MEDROL) injection 40 mg -     methylPREDNISolone acetate (DEPO-MEDROL) injection 40 mg  Chronic cough -     DG Chest 2 View -     guaiFENesin (MUCINEX) 600 MG 12 hr tablet; Take 1 tablet (600 mg total) by mouth 2 (two) times daily as needed for cough or to loosen phlegm. -     methylPREDNISolone acetate (DEPO-MEDROL) injection 40 mg -     benzonatate (TESSALON) 100 MG capsule; Take 1 capsule (100 mg total) by mouth 3 (three) times daily as needed for cough.  Allergic rhinitis, unspecified seasonality, unspecified trigger -     Discontinue: fexofenadine (ALLEGRA ALLERGY) 180 MG tablet; Take 1 tablet (180 mg total) by mouth daily. -     fluticasone (FLONASE) 50 MCG/ACT nasal spray; Place 2 sprays into both nostrils daily. -     montelukast (SINGULAIR) 10 MG tablet; Take 1 tablet (10 mg total) by mouth at bedtime. -     cetirizine (ZYRTEC) 10 MG tablet; Take 1 tablet (10 mg total) by mouth at bedtime. -     methylPREDNISolone acetate (DEPO-MEDROL) injection 40 mg  Other orders -     Discontinue: umeclidinium-vilanterol (ANORO ELLIPTA) 62.5-25 MCG/INH AEPB; Inhale 1 puff into the lungs daily.   I have discontinued Emily Salazar's Ipratropium-Albuterol, umeclidinium-vilanterol, and fexofenadine. I am also having Emily Salazar start on guaiFENesin, fluticasone, montelukast, cetirizine, and benzonatate. Additionally, I am having Emily Salazar maintain Emily Salazar Acetaminophen (TYLENOL 8 HOUR PO), NON FORMULARY, triamcinolone cream, gabapentin, lisinopril, albuterol, ranitidine, and amLODipine. We administered ipratropium-albuterol and methylPREDNISolone acetate. We will continue to administer ipratropium-albuterol.  Meds ordered this encounter  Medications  . ipratropium-albuterol (DUONEB) 0.5-2.5 (3) MG/3ML nebulizer solution 3 mL  . DISCONTD:  umeclidinium-vilanterol (ANORO ELLIPTA) 62.5-25 MCG/INH AEPB    Sig: Inhale 1 puff into the lungs daily.    Dispense:  60 each    Refill:  1    Order Specific Question:   Supervising Provider    Answer:   MATTHEWS, CODY [[id number]]  . guaiFENesin (MUCINEX) 600 MG 12 hr tablet    Sig: Take 1 tablet (600 mg total) by mouth 2 (two) times daily as needed for cough or to loosen phlegm.    Dispense:  14 tablet    Refill:  0    Order Specific Question:   Supervising Provider    Answer:   MATTHEWS, CODY [[id number]]  . DISCONTD: methylPREDNISolone acetate (DEPO-MEDROL) injection 40 mg  . DISCONTD: fexofenadine (ALLEGRA ALLERGY) 180 MG tablet    Sig: Take 1 tablet (180 mg total) by mouth daily.    Order Specific Question:   Supervising Provider    Answer:   MATTHEWS, CODY [[id number]]  . fluticasone (FLONASE) 50 MCG/ACT nasal spray    Sig: Place 2 sprays into both  nostrils daily.    Dispense:  16 g    Refill:  0    Order Specific Question:   Supervising Provider    Answer:   MATTHEWS, CODY [[id number]]  . montelukast (SINGULAIR) 10 MG tablet    Sig: Take 1 tablet (10 mg total) by mouth at bedtime.    Dispense:  30 tablet    Refill:  3    Order Specific Question:   Supervising Provider    Answer:   MATTHEWS, CODY [[id number]]  . cetirizine (ZYRTEC) 10 MG tablet    Sig: Take 1 tablet (10 mg total) by mouth at bedtime.    Dispense:  30 tablet    Refill:  3    Order Specific Question:   Supervising Provider    Answer:   MATTHEWS, CODY [[id number]]  . methylPREDNISolone acetate (DEPO-MEDROL) injection 40 mg  . benzonatate (TESSALON) 100 MG capsule    Sig: Take 1 capsule (100 mg total) by mouth 3 (three) times daily as needed for cough.    Dispense:  30 capsule    Refill:  0    Order Specific Question:   Supervising Provider    Answer:   MATTHEWS, CODY [[id number]]    Follow-up: Return in about 3 months (around 09/24/2018) for HTN.  Emily Pennaharlotte Vernel Donlan, NP

## 2018-06-26 ENCOUNTER — Encounter: Payer: Self-pay | Admitting: Nurse Practitioner

## 2018-06-26 ENCOUNTER — Other Ambulatory Visit: Payer: Self-pay | Admitting: Nurse Practitioner

## 2018-06-26 DIAGNOSIS — R059 Cough, unspecified: Secondary | ICD-10-CM

## 2018-06-26 DIAGNOSIS — R05 Cough: Secondary | ICD-10-CM

## 2018-06-27 ENCOUNTER — Other Ambulatory Visit: Payer: Self-pay | Admitting: Nurse Practitioner

## 2018-06-27 MED ORDER — ALBUTEROL SULFATE HFA 108 (90 BASE) MCG/ACT IN AERS: 1.0000 | INHALATION_SPRAY | Freq: Four times a day (QID) | RESPIRATORY_TRACT | 1 refills | Status: DC | PRN

## 2018-07-05 ENCOUNTER — Encounter: Payer: Self-pay | Admitting: Pulmonary Disease

## 2018-07-05 ENCOUNTER — Other Ambulatory Visit (INDEPENDENT_AMBULATORY_CARE_PROVIDER_SITE_OTHER): Payer: PRIVATE HEALTH INSURANCE

## 2018-07-05 ENCOUNTER — Ambulatory Visit (INDEPENDENT_AMBULATORY_CARE_PROVIDER_SITE_OTHER): Payer: PRIVATE HEALTH INSURANCE | Admitting: Pulmonary Disease

## 2018-07-05 VITALS — BP 132/82 | HR 86 | Ht 62.0 in | Wt 225.4 lb

## 2018-07-05 DIAGNOSIS — R053 Chronic cough: Secondary | ICD-10-CM

## 2018-07-05 DIAGNOSIS — J4540 Moderate persistent asthma, uncomplicated: Secondary | ICD-10-CM | POA: Diagnosis not present

## 2018-07-05 DIAGNOSIS — R05 Cough: Secondary | ICD-10-CM

## 2018-07-05 DIAGNOSIS — Z6841 Body Mass Index (BMI) 40.0 and over, adult: Secondary | ICD-10-CM

## 2018-07-05 HISTORY — DX: Morbid (severe) obesity due to excess calories: E66.01

## 2018-07-05 HISTORY — DX: Body Mass Index (BMI) 40.0 and over, adult: Z684

## 2018-07-05 LAB — CBC WITH DIFFERENTIAL/PLATELET
BASOS PCT: 0.6 % (ref 0.0–3.0)
Basophils Absolute: 0 10*3/uL (ref 0.0–0.1)
EOS ABS: 0.2 10*3/uL (ref 0.0–0.7)
EOS PCT: 3.5 % (ref 0.0–5.0)
HEMATOCRIT: 33.6 % — AB (ref 36.0–46.0)
HEMOGLOBIN: 11 g/dL — AB (ref 12.0–15.0)
LYMPHS PCT: 35.6 % (ref 12.0–46.0)
Lymphs Abs: 2.5 10*3/uL (ref 0.7–4.0)
MCHC: 32.9 g/dL (ref 30.0–36.0)
MCV: 81.2 fl (ref 78.0–100.0)
Monocytes Absolute: 0.4 10*3/uL (ref 0.1–1.0)
Monocytes Relative: 6.2 % (ref 3.0–12.0)
Neutro Abs: 3.8 10*3/uL (ref 1.4–7.7)
Neutrophils Relative %: 54.1 % (ref 43.0–77.0)
Platelets: 334 10*3/uL (ref 150.0–400.0)
RBC: 4.14 Mil/uL (ref 3.87–5.11)
RDW: 15.5 % (ref 11.5–15.5)
WBC: 7 10*3/uL (ref 4.0–10.5)

## 2018-07-05 MED ORDER — BUDESONIDE-FORMOTEROL FUMARATE 160-4.5 MCG/ACT IN AERO: 2.0000 | INHALATION_SPRAY | Freq: Two times a day (BID) | RESPIRATORY_TRACT | 0 refills | Status: DC

## 2018-07-05 MED ORDER — ALBUTEROL SULFATE (2.5 MG/3ML) 0.083% IN NEBU: 2.5000 mg | INHALATION_SOLUTION | RESPIRATORY_TRACT | 5 refills | Status: DC | PRN

## 2018-07-05 MED ORDER — BUDESONIDE-FORMOTEROL FUMARATE 160-4.5 MCG/ACT IN AERO: 2.0000 | INHALATION_SPRAY | Freq: Two times a day (BID) | RESPIRATORY_TRACT | 6 refills | Status: DC

## 2018-07-05 MED ORDER — VITAMIN D 50 MCG (2000 UT) PO CAPS: 2000.0000 [IU] | ORAL_CAPSULE | Freq: Every day | ORAL | 5 refills | Status: DC

## 2018-07-05 MED ORDER — AEROCHAMBER MV MISC: 0 refills | Status: AC

## 2018-07-05 NOTE — Patient Instructions (Addendum)
Thank you for visiting Dr. Tonia Brooms at [facility]. Today we recommend the following: Orders Placed This Encounter  Procedures  . CBC w/Diff  . Resp Allergy Profile Regn2DC [facility]  . Ambulatory Referral for DME  . Pulmonary function test   Meds ordered this encounter  Medications  . budesonide-formoterol (SYMBICORT) 160-4.5 MCG/ACT inhaler    Sig: Inhale 2 puffs into the lungs every 12 (twelve) hours.    Dispense:  1 Inhaler    Refill:  6  . Spacer/Aero-Holding Chambers (AEROCHAMBER MV) inhaler    Sig: Use as instructed    Dispense:  1 each    Refill:  0  . albuterol (PROVENTIL) (2.5 MG/3ML) 0.083% nebulizer solution    Sig: Take 3 mLs (2.5 mg total) by nebulization every 4 (four) hours as needed for wheezing or shortness of breath.    Dispense:  360 mL    Refill:  5   Return in about 5 weeks (around 08/09/2018), or if symptoms worsen or fail to improve.

## 2018-07-05 NOTE — Progress Notes (Signed)
Synopsis: Referred in September 2019 for cough by Anne Ng, NP  Subjective:   PATIENT ID: Emily Salazar GENDER: female DOB: 03/27/1981, MRN: [id number]  Chief Complaint  Patient presents with  . Consult    SOB, has tried 4 different inhalers, developed bronchitis. Cough until she vomits for years. Denies chest tightness or discomfort.     She has pmh of asthma and chronic cough as well as severe allergies. And currently on several allergy medications. 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 She normally has a recurrent sinus infection symptoms at the change of every season. She occasionally has chest tightness. And recently had and exacerbation related to exposure to a strong scented air freshener. Currently taking using a combivent. She was on advair which caused a wet cough. She was given depomedrol shot and she started to feel better. Uses flonase for her seasonal allergies. Live with her dad. Small dog in the home. No cats or birds.    Past Medical History:  Diagnosis Date  . ADHD    not taking med right now  . Asthma   . BMI 40.0-44.9, adult (HCC) 07/05/2018  . Depression    no med taking  . Hypertension   . Morbid obesity due to excess calories (HCC) 07/05/2018     Family History  Problem Relation Age of Onset  . Diabetes Mother   . Cancer Maternal Aunt        lung cancer  . Heart disease Maternal Uncle   . Cancer Maternal Grandmother        brain cancer  . Crohn's disease Cousin      Past Surgical History:  Procedure Laterality Date  . CESAREAN SECTION  2004 and 2007  . CHOLECYSTECTOMY  2007  . KIDNEY STONE SURGERY  2013   2 times  . NOSE SURGERY  1991   broken  . TUBAL LIGATION  2007    Social History   Socioeconomic History  . Marital status: Single    Spouse name: Not on file  . Number of children: 2  . Years of education: Not on file  . Highest education level: Not on file  Occupational History  .  Occupation: assembly line    Comment: Oncologist  Social Needs  . Financial resource strain: Not on file  . Food insecurity:    Worry: Not on file    Inability: Not on file  . Transportation needs:    Medical: Not on file    Non-medical: Not on file  Tobacco Use  . Smoking status: Current Every Day Smoker    Packs/day: 0.50  . Smokeless tobacco: Never Used  . Tobacco comment: 5 cigarettes per day   Substance and Sexual Activity  . Alcohol use: Not Currently    Frequency: Never    Comment: rare  . Drug use: Yes    Frequency: 2.0 times per week    Types: Marijuana  . Sexual activity: Not Currently  Lifestyle  . Physical activity:    Days per week: Not on file    Minutes per session: Not on file  . Stress: Not on file  Relationships  . Social connections:    Talks on phone: Not on file    Gets together: Not on file    Attends religious service: Not on file    Active member of club or organization: Not on file    Attends meetings of clubs or organizations: Not on  file    Relationship status: Not on file  . Intimate partner violence:    Fear of current or ex partner: Not on file    Emotionally abused: Not on file    Physically abused: Not on file    Forced sexual activity: Not on file  Other Topics Concern  . Not on file  Social History Narrative   Home with sister at this time.   Children in [place] (father has custody)    Incarcerated for 62yrs.   Released 07/2017 from [facility].     Allergies  Allergen Reactions  . Penicillins Swelling    Blister on lips,neck and shoulder. Ask first  . Sulfa Antibiotics Swelling     Outpatient Medications Prior to Visit  Medication Sig Dispense Refill  . Acetaminophen (TYLENOL 8 HOUR PO) Take by mouth.    Marland Kitchen albuterol (PROAIR HFA) 108 (90 Base) MCG/ACT inhaler Inhale 1-2 puffs into the lungs every 6 (six) hours as needed for wheezing or shortness of breath. 1 Inhaler 1  . amLODipine (NORVASC) 5 MG tablet Take 1 tablet (5  mg total) by mouth at bedtime. 90 tablet 3  . benzonatate (TESSALON) 100 MG capsule Take 1 capsule (100 mg total) by mouth 3 (three) times daily as needed for cough. 30 capsule 0  . cetirizine (ZYRTEC) 10 MG tablet Take 1 tablet (10 mg total) by mouth at bedtime. 30 tablet 3  . fluticasone (FLONASE) 50 MCG/ACT nasal spray Place 2 sprays into both nostrils daily. 16 g 0  . gabapentin (NEURONTIN) 300 MG capsule Take 1 capsule (300 mg total) by mouth at bedtime. 30 capsule 3  . lisinopril (PRINIVIL,ZESTRIL) 20 MG tablet Take 1 tablet (20 mg total) by mouth daily. 90 tablet 3  . montelukast (SINGULAIR) 10 MG tablet Take 1 tablet (10 mg total) by mouth at bedtime. 30 tablet 3  . ranitidine (ZANTAC) 150 MG tablet TAKE 1 TABLET BY MOUTH TWICE DAILY 180 tablet 0  . triamcinolone cream (KENALOG) 0.1 % Apply 1 application topically 2 (two) times daily. 30 g 0  . guaiFENesin (MUCINEX) 600 MG 12 hr tablet Take 1 tablet (600 mg total) by mouth 2 (two) times daily as needed for cough or to loosen phlegm. (Patient not taking: Reported on 07/05/2018) 14 tablet 0  . NON FORMULARY OTC for allergy     Facility-Administered Medications Prior to Visit  Medication Dose Route Frequency Provider Last Rate Last Dose  . ipratropium-albuterol (DUONEB) 0.5-2.5 (3) MG/3ML nebulizer solution 3 mL  3 mL Nebulization Q6H Nche, Bonna Gains, NP   3 mL at 06/25/18 1359    Review of Systems  Constitutional: Negative for chills, fever, malaise/fatigue and weight loss.  HENT: Negative for hearing loss, sore throat and tinnitus.   Eyes: Negative for blurred vision and double vision.  Respiratory: Positive for cough, sputum production, shortness of breath and wheezing. Negative for hemoptysis and stridor.   Cardiovascular: Negative for chest pain, palpitations, orthopnea, leg swelling and PND.  Gastrointestinal: Positive for heartburn. Negative for abdominal pain, constipation, diarrhea, nausea and vomiting.  Genitourinary:  Negative for dysuria, hematuria and urgency.  Musculoskeletal: Negative for joint pain and myalgias.  Skin: Negative for itching and rash.  Neurological: Negative for dizziness, tingling, weakness and headaches.  Endo/Heme/Allergies: Negative for environmental allergies. Does not bruise/bleed easily.  Psychiatric/Behavioral: Negative for depression. The patient is not nervous/anxious and does not have insomnia.   All other systems reviewed and are negative.    Objective:  Physical  Exam  Constitutional: She is oriented to person, place, and time. She appears well-developed and well-nourished. No distress.  HENT:  Head: Normocephalic and atraumatic.  Mouth/Throat: Oropharynx is clear and moist.  Eyes: Pupils are equal, round, and reactive to light. Conjunctivae are normal. No scleral icterus.  Neck: Neck supple. No JVD present. No tracheal deviation present.  Cardiovascular: Normal rate, regular rhythm, normal heart sounds and intact distal pulses.  No murmur heard. Pulmonary/Chest: Effort normal and breath sounds normal. No accessory muscle usage or stridor. No tachypnea. No respiratory distress. She has no wheezes. She has no rhonchi. She has no rales.  Obese pannus  Abdominal: Soft. Bowel sounds are normal. She exhibits no distension. There is no tenderness.  Musculoskeletal: She exhibits no edema or tenderness.  Lymphadenopathy:    She has no cervical adenopathy.  Neurological: She is alert and oriented to person, place, and time.  Skin: Skin is warm and dry. Capillary refill takes less than 2 seconds. No rash noted.  Psychiatric: She has a normal mood and affect. Her behavior is normal.  Vitals reviewed.    Vitals:   07/05/18 1343  BP: 132/82  Pulse: 86  SpO2: 100%  Weight: 225 lb 6.4 oz (102.2 kg)  Height: 5\' 2"  (1.575 m)   100% on  RA BMI Readings from Last 3 Encounters:  07/05/18 41.23 kg/m  06/25/18 41.15 kg/m  05/20/18 42.43 kg/m   Wt Readings from Last 3  Encounters:  07/05/18 225 lb 6.4 oz (102.2 kg)  06/25/18 225 lb (102.1 kg)  05/20/18 232 lb (105.2 kg)     CBC    Component Value Date/Time   WBC 7.0 07/05/2018 1459   RBC 4.14 07/05/2018 1459   HGB 11.0 (L) 07/05/2018 1459   HCT 33.6 (L) 07/05/2018 1459   PLT 334.0 07/05/2018 1459   MCV 81.2 07/05/2018 1459   MCH 24.0 (L) 01/04/2018 1412   MCHC 32.9 07/05/2018 1459   RDW 15.5 07/05/2018 1459   LYMPHSABS 2.5 07/05/2018 1459   MONOABS 0.4 07/05/2018 1459   EOSABS 0.2 07/05/2018 1459   BASOSABS 0.0 07/05/2018 1459    Chest Imaging: 06/25/2018 chest x-ray: No active cardia pulmonary process The patient's images have been independently reviewed by me.    Pulmonary Functions Testing Results: None   FeNO: None   Pathology: None   Echocardiogram: None   Heart Catheterization: None     Assessment & Plan:   Chronic cough - Plan: Pulmonary function test, CBC w/Diff, Resp Allergy Profile Regn2DC [facility], Ambulatory Referral for DME  BMI 40.0-44.9, adult (HCC)  Morbid obesity due to excess calories (HCC)  Moderate persistent asthma without complication  Discussion:  This is a 37 year old female that likely has uncontrolled asthma.  She has a history of allergies, atopy, reflux and she is morbidly obese.  I believe her chronic cough is related to this.  Plan:  We will check regional allergy panel and CBC with differential to look absolute eosinophil count Continue Flonase continue Singulair continue Zantac. We will obtain pulmonary function tests upon next office visit. Commend starting vitamin D supplementation 2000 units daily Given Symbicort samples as well as prescription for Symbicort 160, 2 puffs daily with spacer We will give albuterol nebulizer for home use, every 4-6 hours as needed for shortness of breath and wheezing. Return to clinic in 4 to 6 weeks.    Current Outpatient Medications:  .  Acetaminophen (TYLENOL 8 HOUR PO), Take by mouth., Disp: ,  Rfl:  .  albuterol (PROAIR HFA) 108 (90 Base) MCG/ACT inhaler, Inhale 1-2 puffs into the lungs every 6 (six) hours as needed for wheezing or shortness of breath., Disp: 1 Inhaler, Rfl: 1 .  amLODipine (NORVASC) 5 MG tablet, Take 1 tablet (5 mg total) by mouth at bedtime., Disp: 90 tablet, Rfl: 3 .  benzonatate (TESSALON) 100 MG capsule, Take 1 capsule (100 mg total) by mouth 3 (three) times daily as needed for cough., Disp: 30 capsule, Rfl: 0 .  cetirizine (ZYRTEC) 10 MG tablet, Take 1 tablet (10 mg total) by mouth at bedtime., Disp: 30 tablet, Rfl: 3 .  fluticasone (FLONASE) 50 MCG/ACT nasal spray, Place 2 sprays into both nostrils daily., Disp: 16 g, Rfl: 0 .  gabapentin (NEURONTIN) 300 MG capsule, Take 1 capsule (300 mg total) by mouth at bedtime., Disp: 30 capsule, Rfl: 3 .  lisinopril (PRINIVIL,ZESTRIL) 20 MG tablet, Take 1 tablet (20 mg total) by mouth daily., Disp: 90 tablet, Rfl: 3 .  montelukast (SINGULAIR) 10 MG tablet, Take 1 tablet (10 mg total) by mouth at bedtime., Disp: 30 tablet, Rfl: 3 .  ranitidine (ZANTAC) 150 MG tablet, TAKE 1 TABLET BY MOUTH TWICE DAILY, Disp: 180 tablet, Rfl: 0 .  triamcinolone cream (KENALOG) 0.1 %, Apply 1 application topically 2 (two) times daily., Disp: 30 g, Rfl: 0 .  albuterol (PROVENTIL) (2.5 MG/3ML) 0.083% nebulizer solution, Take 3 mLs (2.5 mg total) by nebulization every 4 (four) hours as needed for wheezing or shortness of breath., Disp: 360 mL, Rfl: 5 .  budesonide-formoterol (SYMBICORT) 160-4.5 MCG/ACT inhaler, Inhale 2 puffs into the lungs 2 (two) times daily for 1 day., Disp: 1 Inhaler, Rfl: 0 .  Cholecalciferol (VITAMIN D) 2000 units CAPS, Take 1 capsule (2,000 Units total) by mouth daily., Disp: 30 capsule, Rfl: 5 .  guaiFENesin (MUCINEX) 600 MG 12 hr tablet, Take 1 tablet (600 mg total) by mouth 2 (two) times daily as needed for cough or to loosen phlegm. (Patient not taking: Reported on 07/05/2018), Disp: 14 tablet, Rfl: 0 .  NON FORMULARY, OTC  for allergy, Disp: , Rfl:  .  Spacer/Aero-Holding Chambers (AEROCHAMBER MV) inhaler, Use as instructed, Disp: 1 each, Rfl: 0  Current Facility-Administered Medications:  .  ipratropium-albuterol (DUONEB) 0.5-2.5 (3) MG/3ML nebulizer solution 3 mL, 3 mL, Nebulization, Q6H, Nche, Bonna Gains, NP, 3 mL at 06/25/18 1359   Josephine Igo, DO [facility] 07/05/2018 5:18 PM

## 2018-07-08 LAB — RESPIRATORY ALLERGY PROFILE REGION II ~~LOC~~
Allergen, D pternoyssinus,d7: <0.1 kU/L
Allergen, Mouse Urine Protein, e78: <0.1 kU/L
Allergen, Mulberry, t76: <0.1 kU/L
Allergen, Oak,t7: <0.1 kU/L
Aspergillus fumigatus, m3: <0.1 kU/L
CLASS: 0
CLASS: 0
CLASS: 0
CLASS: 0
CLASS: 0
CLASS: 0
Class: 0
Class: 0
Class: 0
Class: 0
Class: 0
Class: 0
Class: 0
Class: 0
Class: 0
Class: 0
Class: 0
Class: 0
Class: 0
Class: 0
Class: 0
Class: 0
Class: 0
Class: 0
Cockroach: <0.1 kU/L
D. farinae: <0.1 kU/L
IGE (IMMUNOGLOBULIN E), SERUM: 20 kU/L (ref <=114)
Rough Pigweed  IgE: <0.1 kU/L
Timothy Grass: <0.1 kU/L

## 2018-07-08 LAB — INTERPRETATION:

## 2018-07-27 ENCOUNTER — Encounter: Payer: Self-pay | Admitting: Nurse Practitioner

## 2018-08-20 ENCOUNTER — Ambulatory Visit (INDEPENDENT_AMBULATORY_CARE_PROVIDER_SITE_OTHER): Payer: BLUE CROSS/BLUE SHIELD | Admitting: Nurse Practitioner

## 2018-08-20 ENCOUNTER — Encounter: Payer: Self-pay | Admitting: Nurse Practitioner

## 2018-08-20 ENCOUNTER — Ambulatory Visit: Payer: Self-pay | Admitting: *Deleted

## 2018-08-20 VITALS — BP 140/92 | HR 98 | Temp 99.6°F | Ht 62.0 in | Wt 233.0 lb

## 2018-08-20 DIAGNOSIS — R197 Diarrhea, unspecified: Secondary | ICD-10-CM | POA: Diagnosis not present

## 2018-08-20 DIAGNOSIS — Z9851 Tubal ligation status: Secondary | ICD-10-CM | POA: Insufficient documentation

## 2018-08-20 DIAGNOSIS — R350 Frequency of micturition: Secondary | ICD-10-CM | POA: Diagnosis not present

## 2018-08-20 DIAGNOSIS — Z9049 Acquired absence of other specified parts of digestive tract: Secondary | ICD-10-CM | POA: Insufficient documentation

## 2018-08-20 LAB — POCT URINALYSIS DIPSTICK
Bilirubin, UA: NEGATIVE
Blood, UA: NEGATIVE
GLUCOSE UA: NEGATIVE
KETONES UA: NEGATIVE
Leukocytes, UA: NEGATIVE
Nitrite, UA: NEGATIVE
Protein, UA: POSITIVE — AB
SPEC GRAV UA: 1.02 (ref 1.010–1.025)
Urobilinogen, UA: 0.2 E.U./dL
pH, UA: 6 (ref 5.0–8.0)

## 2018-08-20 MED ORDER — DIPHENOXYLATE-ATROPINE 2.5-0.025 MG PO TABS: 1.0000 | ORAL_TABLET | Freq: Four times a day (QID) | ORAL | 0 refills | Status: DC | PRN

## 2018-08-20 NOTE — Progress Notes (Signed)
Subjective:  Patient ID: Emily Salazar, female    DOB: 12-19-1980  Age: 37 y.o. MRN: [id number]  CC: Diarrhea (diarrhea and abd pain for 3 days.took Pepto-Bismol, Gas-x OTC anti-diarrheal, ineffective. denied eat anything different. need note for work.)   Diarrhea   This is a new problem. The current episode started in the past 7 days. The problem occurs 2 to 4 times per day. The problem has been unchanged. The stool consistency is described as watery. The patient states that diarrhea does not awaken her from sleep. Associated symptoms include abdominal pain, bloating and increased flatus. Pertinent negatives include no chills, coughing, fever, sweats, URI or weight loss. There are no known risk factors. She has tried anti-motility drug and bismuth subsalicylate for the symptoms.  no travel, no known sick contact, no known contaminated food.   Diarrhea: 4-5 times per day Liquid, no black stool. No known ingestion of food.  Reviewed past Medical, Social and Family history today.  Outpatient Medications Prior to Visit  Medication Sig Dispense Refill  . Acetaminophen (TYLENOL 8 HOUR PO) Take by mouth.    Marland Kitchen albuterol (PROAIR HFA) 108 (90 Base) MCG/ACT inhaler Inhale 1-2 puffs into the lungs every 6 (six) hours as needed for wheezing or shortness of breath. 1 Inhaler 1  . albuterol (PROVENTIL) (2.5 MG/3ML) 0.083% nebulizer solution Take 3 mLs (2.5 mg total) by nebulization every 4 (four) hours as needed for wheezing or shortness of breath. 360 mL 5  . amLODipine (NORVASC) 5 MG tablet Take 1 tablet (5 mg total) by mouth at bedtime. 90 tablet 3  . cetirizine (ZYRTEC) 10 MG tablet Take 1 tablet (10 mg total) by mouth at bedtime. 30 tablet 3  . Cholecalciferol (VITAMIN D) 2000 units CAPS Take 1 capsule (2,000 Units total) by mouth daily. 30 capsule 5  . fluticasone (FLONASE) 50 MCG/ACT nasal spray Place 2 sprays into both nostrils daily. 16 g 0  . gabapentin (NEURONTIN) 300 MG capsule Take 1 capsule  (300 mg total) by mouth at bedtime. 30 capsule 3  . lisinopril (PRINIVIL,ZESTRIL) 20 MG tablet Take 1 tablet (20 mg total) by mouth daily. 90 tablet 3  . montelukast (SINGULAIR) 10 MG tablet Take 1 tablet (10 mg total) by mouth at bedtime. 30 tablet 3  . NON FORMULARY OTC for allergy    . ranitidine (ZANTAC) 150 MG tablet TAKE 1 TABLET BY MOUTH TWICE DAILY 180 tablet 0  . Spacer/Aero-Holding Chambers (AEROCHAMBER MV) inhaler Use as instructed 1 each 0  . triamcinolone cream (KENALOG) 0.1 % Apply 1 application topically 2 (two) times daily. 30 g 0  . budesonide-formoterol (SYMBICORT) 160-4.5 MCG/ACT inhaler Inhale 2 puffs into the lungs 2 (two) times daily for 1 day. 1 Inhaler 0  . benzonatate (TESSALON) 100 MG capsule Take 1 capsule (100 mg total) by mouth 3 (three) times daily as needed for cough. (Patient not taking: Reported on 08/20/2018) 30 capsule 0  . guaiFENesin (MUCINEX) 600 MG 12 hr tablet Take 1 tablet (600 mg total) by mouth 2 (two) times daily as needed for cough or to loosen phlegm. (Patient not taking: Reported on 07/05/2018) 14 tablet 0   Facility-Administered Medications Prior to Visit  Medication Dose Route Frequency Provider Last Rate Last Dose  . ipratropium-albuterol (DUONEB) 0.5-2.5 (3) MG/3ML nebulizer solution 3 mL  3 mL Nebulization Q6H Breta Demedeiros, Bonna Gains, NP   3 mL at 06/25/18 1359    ROS See HPI  Objective:  BP (!) 140/92   Pulse  98   Temp 99.6 F (37.6 C) (Oral)   Ht 5\' 2"  (1.575 m)   Wt 233 lb (105.7 kg)   LMP 07/26/2018   SpO2 99%   BMI 42.62 kg/m   BP Readings from Last 3 Encounters:  08/20/18 (!) 140/92  07/05/18 132/82  06/25/18 124/84    Wt Readings from Last 3 Encounters:  08/20/18 233 lb (105.7 kg)  07/05/18 225 lb 6.4 oz (102.2 kg)  06/25/18 225 lb (102.1 kg)    Physical Exam  Constitutional: She is oriented to person, place, and time. She appears well-developed and well-nourished.  Cardiovascular: Normal rate.  Pulmonary/Chest:  Effort normal.  Abdominal: Soft. Bowel sounds are normal. She exhibits no distension. There is tenderness in the suprapubic area and left lower quadrant. There is no guarding.  Neurological: She is alert and oriented to person, place, and time.  Vitals reviewed.   Lab Results  Component Value Date   WBC 7.0 07/05/2018   HGB 11.0 (L) 07/05/2018   HCT 33.6 (L) 07/05/2018   PLT 334.0 07/05/2018   GLUCOSE 84 05/20/2018   ALT 29 01/04/2018   AST 23 01/04/2018   NA 139 05/20/2018   K 3.7 05/20/2018   CL 108 05/20/2018   CREATININE 0.86 05/20/2018   BUN 9 05/20/2018   CO2 25 05/20/2018   TSH 0.68 05/20/2018   HGBA1C 5.7 05/20/2018    Assessment & Plan:   Emily Salazar was seen today for diarrhea.  Diagnoses and all orders for this visit:  Urinary frequency -     POCT urinalysis dipstick -     Urine Culture  Diarrhea, unspecified type -     Stool Culture; Future -     diphenoxylate-atropine (LOMOTIL) 2.5-0.025 MG tablet; Take 1 tablet by mouth 4 (four) times daily as needed for diarrhea or loose stools.   I have discontinued Emily Salazar's guaiFENesin and benzonatate. I am also having her start on diphenoxylate-atropine. Additionally, I am having her maintain her Acetaminophen (TYLENOL 8 HOUR PO), NON FORMULARY, triamcinolone cream, gabapentin, lisinopril, amLODipine, fluticasone, montelukast, cetirizine, ranitidine, albuterol, AEROCHAMBER MV, albuterol, budesonide-formoterol, and Vitamin D. We will continue to administer ipratropium-albuterol.  Meds ordered this encounter  Medications  . diphenoxylate-atropine (LOMOTIL) 2.5-0.025 MG tablet    Sig: Take 1 tablet by mouth 4 (four) times daily as needed for diarrhea or loose stools.    Dispense:  10 tablet    Refill:  0    Order Specific Question:   Supervising Provider    Answer:   MATTHEWS, CODY [[id number]]   Follow-up: Return if symptoms worsen or fail to improve.  Alysia Penna, NP

## 2018-08-20 NOTE — Patient Instructions (Addendum)
Return stool to lab as soon as possible. Maintain adequate oral hydration. Maintain Brat diet for 3days, then advance as tolerated  Diarrhea, Adult Diarrhea is when you have loose and water poop (stool) often. Diarrhea can make you feel weak and cause you to get dehydrated. Dehydration can make you tired and thirsty, make you have a dry mouth, and make it so you pee (urinate) less often. Diarrhea often lasts 2-3 days. However, it can last longer if it is a sign of something more serious. It is important to treat your diarrhea as told by your doctor. Follow these instructions at home: Eating and drinking  Follow these recommendations as told by your doctor:  Take an oral rehydration solution (ORS). This is a drink that is sold at pharmacies and stores.  Drink clear fluids, such as: ? Water. ? Ice chips. ? Diluted fruit juice. ? Low-calorie sports drinks.  Eat bland, easy-to-digest foods in small amounts as you are able. These foods include: ? Bananas. ? Applesauce. ? Rice. ? Low-fat (lean) meats. ? Toast. ? Crackers.  Avoid drinking fluids that have a lot of sugar or caffeine in them.  Avoid alcohol.  Avoid spicy or fatty foods.  General instructions   Drink enough fluid to keep your pee (urine) clear or pale yellow.  Wash your hands often. If you cannot use soap and water, use hand sanitizer.  Make sure that all people in your home wash their hands well and often.  Take over-the-counter and prescription medicines only as told by your doctor.  Rest at home while you get better.  Watch your condition for any changes.  Take a warm bath to help with any burning or pain from having diarrhea.  Keep all follow-up visits as told by your doctor. This is important. Contact a doctor if:  You have a fever.  Your diarrhea gets worse.  You have new symptoms.  You cannot keep fluids down.  You feel light-headed or dizzy.  You have a headache.  You have muscle  cramps. Get help right away if:  You have chest pain.  You feel very weak or you pass out (faint).  You have bloody or black poop or poop that look like tar.  You have very bad pain, cramping, or bloating in your belly (abdomen).  You have trouble breathing or you are breathing very quickly.  Your heart is beating very quickly.  Your skin feels cold and clammy.  You feel confused.  You have signs of dehydration, such as: ? Dark pee, hardly any pee, or no pee. ? Cracked lips. ? Dry mouth. ? Sunken eyes. ? Sleepiness. ? Weakness. This information is not intended to replace advice given to you by your health care provider. Make sure you discuss any questions you have with your health care provider. Document Released: 03/13/2008 Document Revised: 04/14/2016 Document Reviewed: 06/01/2015 Elsevier Interactive Patient Education  2018 [employer]   Colitis Colitis is inflammation of the colon. Colitis may last a short time (acute) or it may last a long time (chronic). What are the causes? This condition may be caused by:  Viruses.  Bacteria.  Reactions to medicine.  Certain autoimmune diseases, such as Crohn disease or ulcerative colitis.  What are the signs or symptoms? Symptoms of this condition include:  Diarrhea.  Passing bloody or tarry stool.  Pain.  Fever.  Vomiting.  Tiredness (fatigue).  Weight loss.  Bloating.  Sudden increase in abdominal pain.  Having fewer bowel movements than  usual.  How is this diagnosed? This condition is diagnosed with a stool test or a blood test. You may also have other tests, including X-rays, a CT scan, or a colonoscopy. How is this treated? Treatment may include:  Resting the bowel. This involves not eating or drinking for a period of time.  Fluids that are given through an IV tube.  Medicine for pain and diarrhea.  Antibiotic medicines.  Cortisone medicines.  Surgery.  Follow these instructions at  home: Eating and drinking  Follow instructions from your health care provider about eating or drinking restrictions.  Drink enough fluid to keep your urine clear or pale yellow.  Work with a dietitian to determine which foods cause your condition to flare up.  Avoid foods that cause flare-ups.  Eat a well-balanced diet. Medicines  Take over-the-counter and prescription medicines only as told by your health care provider.  If you were prescribed an antibiotic medicine, take it as told by your health care provider. Do not stop taking the antibiotic even if you start to feel better. General instructions  Keep all follow-up visits as told by your health care provider. This is important. Contact a health care provider if:  Your symptoms do not go away.  You develop new symptoms. Get help right away if:  You have a fever that does not go away with treatment.  You develop chills.  You have extreme weakness, fainting, or dehydration.  You have repeated vomiting.  You develop severe pain in your abdomen.  You pass bloody or tarry stool. This information is not intended to replace advice given to you by your health care provider. Make sure you discuss any questions you have with your health care provider. Document Released: 11/02/2004 Document Revised: 03/02/2016 Document Reviewed: 01/18/2015 Elsevier Interactive Patient Education  [employer].

## 2018-08-20 NOTE — Telephone Encounter (Signed)
Pt called to report diarrhea, onset Friday. Reports "Everything goes right through me." States 3 x today. Also reports abdominal pain, constant 7-8/10 since Friday. Pt states stools are loose, at times watery. Has taken Pepto-Bismol, Gas-x OTC anti-diarrheal, ineffective.  Pt displeased with triage process, having to answer questions re symptoms, angry affect. Requesting appt with C. Nche. Because of delay in attempting to secure appt, pt states she will go to an UC.  Answer Assessment - Initial Assessment Questions 1. DIARRHEA SEVERITY: "How bad is the diarrhea?" "How many extra stools have you had in the past 24 hours than normal?"    - NO DIARRHEA (SCALE 0)   - MILD (SCALE 1-3): Few loose or mushy BMs; increase of 1-3 stools over normal daily number of stools; mild increase in ostomy output.   -  MODERATE (SCALE 4-7): Increase of 4-6 stools daily over normal; moderate increase in ostomy output. * SEVERE (SCALE 8-10; OR 'WORST POSSIBLE'): Increase of 7 or more stools daily over normal; moderate increase in ostomy output; incontinence.    3 x's today, "Everything goes right through me." 2. ONSET: "When did the diarrhea begin?"      Friday 3. BM CONSISTENCY: "How loose or watery is the diarrhea?"      Loose, no form, at times watery 4. VOMITING: "Are you also vomiting?" If so, ask: "How many times in the past 24 hours?"      no 5. ABDOMINAL PAIN: "Are you having any abdominal pain?" If yes: "What does it feel like?" (e.g., crampy, dull, intermittent, constant)     Yes, constant 6. ABDOMINAL PAIN SEVERITY: If present, ask: "How bad is the pain?"  (e.g., Scale 1-10; mild, moderate, or severe)   - MILD (1-3): doesn't interfere with normal activities, abdomen soft and not tender to touch    - MODERATE (4-7): interferes with normal activities or awakens from sleep, tender to touch    - SEVERE (8-10): excruciating pain, doubled over, unable to do any normal activities       7-8/10 7. ORAL INTAKE: If  vomiting, "Have you been able to drink liquids?" "How much fluids have you had in the past 24 hours?"     n/a 8. HYDRATION: "Any signs of dehydration?" (e.g., dry mouth [not just dry lips], too weak to stand, dizziness, new weight loss) "When did you last urinate?"      9. EXPOSURE: "Have you traveled to a foreign country recently?" "Have you been exposed to anyone with diarrhea?" "Could you have eaten any food that was spoiled?"      10. ANTIBIOTIC USE: "Are you taking antibiotics now or have you taken antibiotics in the past 2 months?"        11. OTHER SYMPTOMS: "Do you have any other symptoms?" (e.g., fever, blood in stool)  Protocols used: DIARRHEA-A-AH

## 2018-08-21 LAB — URINE CULTURE
MICRO NUMBER: 91361313
SPECIMEN QUALITY:: ADEQUATE

## 2018-08-30 ENCOUNTER — Ambulatory Visit: Payer: PRIVATE HEALTH INSURANCE | Admitting: Pulmonary Disease

## 2018-08-30 NOTE — Progress Notes (Deleted)
Synopsis: Referred in September 2019 for cough by Anne NgNche, Charlotte Lum, NP  Subjective:   PATIENT ID: Emily ChamberKizzy Winkleman GENDER: female DOB: 1981/01/06, MRN: [id number]  No chief complaint on file.   She has pmh of asthma and chronic cough as well as severe allergies. And currently on several allergy medications. 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 She normally has a recurrent sinus infection symptoms at the change of every season. She occasionally has chest tightness. And recently had and exacerbation related to exposure to a strong scented air freshener. Currently taking using a combivent. She was on advair which caused a wet cough. She was given depomedrol shot and she started to feel better. Uses flonase for her seasonal allergies. Live with her dad. Small dog in the home. No cats or birds.    Past Medical History:  Diagnosis Date  . ADHD    not taking med right now  . Asthma   . BMI 40.0-44.9, adult (HCC) 07/05/2018  . Depression    no med taking  . Hypertension   . Morbid obesity due to excess calories (HCC) 07/05/2018     Family History  Problem Relation Age of Onset  . Diabetes Mother   . Cancer Maternal Aunt        lung cancer  . Heart disease Maternal Uncle   . Cancer Maternal Grandmother        brain cancer  . Crohn's disease Cousin      Past Surgical History:  Procedure Laterality Date  . CESAREAN SECTION  2004 and 2007  . CHOLECYSTECTOMY  2007  . KIDNEY STONE SURGERY  2013   2 times  . NOSE SURGERY  1991   broken  . TUBAL LIGATION  2007    Social History   Socioeconomic History  . Marital status: Single    Spouse name: Not on file  . Number of children: 2  . Years of education: Not on file  . Highest education level: Not on file  Occupational History  . Occupation: assembly line    Comment: [employer]  Social Needs  . Financial resource strain: Not on file  . Food insecurity:    Worry: Not on file   Inability: Not on file  . Transportation needs:    Medical: Not on file    Non-medical: Not on file  Tobacco Use  . Smoking status: Current Every Day Smoker    Packs/day: 0.50  . Smokeless tobacco: Never Used  . Tobacco comment: 5 cigarettes per day   Substance and Sexual Activity  . Alcohol use: Not Currently    Frequency: Never    Comment: rare  . Drug use: Yes    Frequency: 2.0 times per week    Types: Marijuana  . Sexual activity: Not Currently  Lifestyle  . Physical activity:    Days per week: Not on file    Minutes per session: Not on file  . Stress: Not on file  Relationships  . Social connections:    Talks on phone: Not on file    Gets together: Not on file    Attends religious service: Not on file    Active member of club or organization: Not on file    Attends meetings of clubs or organizations: Not on file    Relationship status: Not on file  . Intimate partner violence:    Fear of current or ex partner: Not on file    Emotionally  abused: Not on file    Physically abused: Not on file    Forced sexual activity: Not on file  Other Topics Concern  . Not on file  Social History Narrative   Home with sister at this time.   Children in [place] (father has custody)    Incarcerated for 51yrs.   Released 07/2017 from [facility].     Allergies  Allergen Reactions  . Penicillins Swelling    Blister on lips,neck and shoulder. Ask first  . Sulfa Antibiotics Swelling     Outpatient Medications Prior to Visit  Medication Sig Dispense Refill  . Acetaminophen (TYLENOL 8 HOUR PO) Take by mouth.    Marland Kitchen albuterol (PROAIR HFA) 108 (90 Base) MCG/ACT inhaler Inhale 1-2 puffs into the lungs every 6 (six) hours as needed for wheezing or shortness of breath. 1 Inhaler 1  . albuterol (PROVENTIL) (2.5 MG/3ML) 0.083% nebulizer solution Take 3 mLs (2.5 mg total) by nebulization every 4 (four) hours as needed for wheezing or shortness of breath. 360 mL 5  . amLODipine (NORVASC) 5 MG  tablet Take 1 tablet (5 mg total) by mouth at bedtime. 90 tablet 3  . budesonide-formoterol (SYMBICORT) 160-4.5 MCG/ACT inhaler Inhale 2 puffs into the lungs 2 (two) times daily for 1 day. 1 Inhaler 0  . cetirizine (ZYRTEC) 10 MG tablet Take 1 tablet (10 mg total) by mouth at bedtime. 30 tablet 3  . Cholecalciferol (VITAMIN D) 2000 units CAPS Take 1 capsule (2,000 Units total) by mouth daily. 30 capsule 5  . diphenoxylate-atropine (LOMOTIL) 2.5-0.025 MG tablet Take 1 tablet by mouth 4 (four) times daily as needed for diarrhea or loose stools. 10 tablet 0  . fluticasone (FLONASE) 50 MCG/ACT nasal spray Place 2 sprays into both nostrils daily. 16 g 0  . gabapentin (NEURONTIN) 300 MG capsule Take 1 capsule (300 mg total) by mouth at bedtime. 30 capsule 3  . lisinopril (PRINIVIL,ZESTRIL) 20 MG tablet Take 1 tablet (20 mg total) by mouth daily. 90 tablet 3  . montelukast (SINGULAIR) 10 MG tablet Take 1 tablet (10 mg total) by mouth at bedtime. 30 tablet 3  . NON FORMULARY OTC for allergy    . ranitidine (ZANTAC) 150 MG tablet TAKE 1 TABLET BY MOUTH TWICE DAILY 180 tablet 0  . Spacer/Aero-Holding Chambers (AEROCHAMBER MV) inhaler Use as instructed 1 each 0  . triamcinolone cream (KENALOG) 0.1 % Apply 1 application topically 2 (two) times daily. 30 g 0   Facility-Administered Medications Prior to Visit  Medication Dose Route Frequency Provider Last Rate Last Dose  . ipratropium-albuterol (DUONEB) 0.5-2.5 (3) MG/3ML nebulizer solution 3 mL  3 mL Nebulization Q6H Nche, Bonna Gains, NP   3 mL at 06/25/18 1359    ROS   Objective:  Physical Exam   There were no vitals filed for this visit.   on  RA BMI Readings from Last 3 Encounters:  08/20/18 42.62 kg/m  07/05/18 41.23 kg/m  06/25/18 41.15 kg/m   Wt Readings from Last 3 Encounters:  08/20/18 233 lb (105.7 kg)  07/05/18 225 lb 6.4 oz (102.2 kg)  06/25/18 225 lb (102.1 kg)     CBC    Component Value Date/Time   WBC 7.0 07/05/2018  1459   RBC 4.14 07/05/2018 1459   HGB 11.0 (L) 07/05/2018 1459   HCT 33.6 (L) 07/05/2018 1459   PLT 334.0 07/05/2018 1459   MCV 81.2 07/05/2018 1459   MCH 24.0 (L) 01/04/2018 1412   MCHC 32.9 07/05/2018  1459   RDW 15.5 07/05/2018 1459   LYMPHSABS 2.5 07/05/2018 1459   MONOABS 0.4 07/05/2018 1459   EOSABS 0.2 07/05/2018 1459   BASOSABS 0.0 07/05/2018 1459   RAST: Negative IgE: 20  Chest Imaging: 06/25/2018 chest x-ray: No active cardia pulmonary process The patient's images have been independently reviewed by me.    Pulmonary Functions Testing Results: None   FeNO: None   Pathology: None   Echocardiogram: None   Heart Catheterization: None     Assessment & Plan:   Moderate persistent asthma without complication  Eczema, unspecified type  Discussion:  This is a 37 year old female that likely has uncontrolled asthma.  She has a history of allergies, atopy, reflux and she is morbidly obese.  I believe her chronic cough is related to this.  Plan:  We will check regional allergy panel and CBC with differential to look absolute eosinophil count Continue Flonase continue Singulair continue Zantac. We will obtain pulmonary function tests upon next office visit. Commend starting vitamin D supplementation 2000 units daily Given Symbicort samples as well as prescription for Symbicort 160, 2 puffs daily with spacer We will give albuterol nebulizer for home use, every 4-6 hours as needed for shortness of breath and wheezing. Return to clinic in 4 to 6 weeks.    Current Outpatient Medications:  .  Acetaminophen (TYLENOL 8 HOUR PO), Take by mouth., Disp: , Rfl:  .  albuterol (PROAIR HFA) 108 (90 Base) MCG/ACT inhaler, Inhale 1-2 puffs into the lungs every 6 (six) hours as needed for wheezing or shortness of breath., Disp: 1 Inhaler, Rfl: 1 .  albuterol (PROVENTIL) (2.5 MG/3ML) 0.083% nebulizer solution, Take 3 mLs (2.5 mg total) by nebulization every 4 (four) hours as needed  for wheezing or shortness of breath., Disp: 360 mL, Rfl: 5 .  amLODipine (NORVASC) 5 MG tablet, Take 1 tablet (5 mg total) by mouth at bedtime., Disp: 90 tablet, Rfl: 3 .  budesonide-formoterol (SYMBICORT) 160-4.5 MCG/ACT inhaler, Inhale 2 puffs into the lungs 2 (two) times daily for 1 day., Disp: 1 Inhaler, Rfl: 0 .  cetirizine (ZYRTEC) 10 MG tablet, Take 1 tablet (10 mg total) by mouth at bedtime., Disp: 30 tablet, Rfl: 3 .  Cholecalciferol (VITAMIN D) 2000 units CAPS, Take 1 capsule (2,000 Units total) by mouth daily., Disp: 30 capsule, Rfl: 5 .  diphenoxylate-atropine (LOMOTIL) 2.5-0.025 MG tablet, Take 1 tablet by mouth 4 (four) times daily as needed for diarrhea or loose stools., Disp: 10 tablet, Rfl: 0 .  fluticasone (FLONASE) 50 MCG/ACT nasal spray, Place 2 sprays into both nostrils daily., Disp: 16 g, Rfl: 0 .  gabapentin (NEURONTIN) 300 MG capsule, Take 1 capsule (300 mg total) by mouth at bedtime., Disp: 30 capsule, Rfl: 3 .  lisinopril (PRINIVIL,ZESTRIL) 20 MG tablet, Take 1 tablet (20 mg total) by mouth daily., Disp: 90 tablet, Rfl: 3 .  montelukast (SINGULAIR) 10 MG tablet, Take 1 tablet (10 mg total) by mouth at bedtime., Disp: 30 tablet, Rfl: 3 .  NON FORMULARY, OTC for allergy, Disp: , Rfl:  .  ranitidine (ZANTAC) 150 MG tablet, TAKE 1 TABLET BY MOUTH TWICE DAILY, Disp: 180 tablet, Rfl: 0 .  Spacer/Aero-Holding Chambers (AEROCHAMBER MV) inhaler, Use as instructed, Disp: 1 each, Rfl: 0 .  triamcinolone cream (KENALOG) 0.1 %, Apply 1 application topically 2 (two) times daily., Disp: 30 g, Rfl: 0  Current Facility-Administered Medications:  .  ipratropium-albuterol (DUONEB) 0.5-2.5 (3) MG/3ML nebulizer solution 3 mL, 3 mL, Nebulization, Q6H, Nche,  Bonna Gains, NP, 3 mL at 06/25/18 1359   Josephine Igo, DO [facility] 08/30/2018 9:07 AM

## 2018-09-02 ENCOUNTER — Encounter: Payer: Self-pay | Admitting: Nurse Practitioner

## 2018-09-02 ENCOUNTER — Ambulatory Visit (INDEPENDENT_AMBULATORY_CARE_PROVIDER_SITE_OTHER): Payer: BLUE CROSS/BLUE SHIELD | Admitting: Nurse Practitioner

## 2018-09-02 ENCOUNTER — Encounter: Payer: Self-pay | Admitting: General Surgery

## 2018-09-02 DIAGNOSIS — R053 Chronic cough: Secondary | ICD-10-CM

## 2018-09-02 DIAGNOSIS — J4540 Moderate persistent asthma, uncomplicated: Secondary | ICD-10-CM | POA: Diagnosis not present

## 2018-09-02 DIAGNOSIS — R05 Cough: Secondary | ICD-10-CM | POA: Diagnosis not present

## 2018-09-02 MED ORDER — OMEPRAZOLE 20 MG PO CPDR: 20.0000 mg | DELAYED_RELEASE_CAPSULE | Freq: Every day | ORAL | 11 refills | Status: AC

## 2018-09-02 MED ORDER — BUDESONIDE-FORMOTEROL FUMARATE 160-4.5 MCG/ACT IN AERO: 2.0000 | INHALATION_SPRAY | Freq: Two times a day (BID) | RESPIRATORY_TRACT | 0 refills | Status: DC

## 2018-09-02 MED ORDER — BENZONATATE 100 MG PO CAPS: 100.0000 mg | ORAL_CAPSULE | Freq: Three times a day (TID) | ORAL | 0 refills | Status: DC

## 2018-09-02 NOTE — Progress Notes (Signed)
 @Patient  ID: Emily ChamberKizzy Salazar, female    DOB: 1981-07-29, 37 y.o.   MRN: [id number]  Chief Complaint  Patient presents with  . Cough    Referring provider: Anne NgNche, Charlotte Lum, NP  HPI 37 year old female current smoker with asthma and chronic cough as well as severe allergies followed by Dr. Tonia Salazar.   Tests: CXR 06/25/18 -no active cardiopulmonary disease  OV 09/02/18 - acute cough Patient presents with cough. She has chronic cough. She states that the cough has increased over the past week.  Cough is nonproductive.  She ran out of her Symbicort sample given at last visit. She states that it was really helping with the cough and would like a refill. She reports that she is not currently taking anything for reflux or allergies. She is still smoking, but has cut back. She is trying to quit.  She denies any shortness of breath, fever, chest pain, or edema.  She does complain of some allergy symptoms such as nasal drainage and postnasal drip.        Allergies  Allergen Reactions  . Penicillins Swelling    Blister on lips,neck and shoulder. Ask first  . Sulfa Antibiotics Swelling     There is no immunization history on file for this patient.  Past Medical History:  Diagnosis Date  . ADHD    not taking med right now  . Asthma   . BMI 40.0-44.9, adult (HCC) 07/05/2018  . Depression    no med taking  . Hypertension   . Morbid obesity due to excess calories (HCC) 07/05/2018    Tobacco History: Social History   Tobacco Use  Smoking Status Current Every Day Smoker  . Packs/day: 0.50  Smokeless Tobacco Never Used  Tobacco Comment   5 cigarettes per day    Ready to quit: Yes Counseling given: Yes Comment: 5 cigarettes per day    Outpatient Encounter Medications as of 09/02/2018  Medication Sig  . Acetaminophen (TYLENOL 8 HOUR PO) Take by mouth.  Marland Kitchen. albuterol (PROAIR HFA) 108 (90 Base) MCG/ACT inhaler Inhale 1-2 puffs into the lungs every 6 (six) hours as needed for wheezing  or shortness of breath.  Marland Kitchen. albuterol (PROVENTIL) (2.5 MG/3ML) 0.083% nebulizer solution Take 3 mLs (2.5 mg total) by nebulization every 4 (four) hours as needed for wheezing or shortness of breath.  Marland Kitchen. amLODipine (NORVASC) 5 MG tablet Take 1 tablet (5 mg total) by mouth at bedtime.  . benzonatate (TESSALON) 100 MG capsule Take 1 capsule (100 mg total) by mouth 3 (three) times daily.  . cetirizine (ZYRTEC) 10 MG tablet Take 1 tablet (10 mg total) by mouth at bedtime.  . Cholecalciferol (VITAMIN D) 2000 units CAPS Take 1 capsule (2,000 Units total) by mouth daily.  . diphenoxylate-atropine (LOMOTIL) 2.5-0.025 MG tablet Take 1 tablet by mouth 4 (four) times daily as needed for diarrhea or loose stools.  . fluticasone (FLONASE) 50 MCG/ACT nasal spray Place 2 sprays into both nostrils daily.  Marland Kitchen. gabapentin (NEURONTIN) 300 MG capsule Take 1 capsule (300 mg total) by mouth at bedtime.  Marland Kitchen. lisinopril (PRINIVIL,ZESTRIL) 20 MG tablet Take 1 tablet (20 mg total) by mouth daily.  . montelukast (SINGULAIR) 10 MG tablet Take 1 tablet (10 mg total) by mouth at bedtime.  . NON FORMULARY OTC for allergy  . Spacer/Aero-Holding Chambers (AEROCHAMBER MV) inhaler Use as instructed  . triamcinolone cream (KENALOG) 0.1 % Apply 1 application topically 2 (two) times daily.  . [DISCONTINUED] benzonatate (TESSALON) 100 MG  capsule   . [DISCONTINUED] ranitidine (ZANTAC) 150 MG tablet TAKE 1 TABLET BY MOUTH TWICE DAILY  . budesonide-formoterol (SYMBICORT) 160-4.5 MCG/ACT inhaler Inhale 2 puffs into the lungs 2 (two) times daily for 1 day.  Marland Kitchen omeprazole (PRILOSEC) 20 MG capsule Take 1 capsule (20 mg total) by mouth daily.  . [DISCONTINUED] budesonide-formoterol (SYMBICORT) 160-4.5 MCG/ACT inhaler Inhale 2 puffs into the lungs 2 (two) times daily for 1 day.   Facility-Administered Encounter Medications as of 09/02/2018  Medication  . ipratropium-albuterol (DUONEB) 0.5-2.5 (3) MG/3ML nebulizer solution 3 mL     Review of  Systems  Review of Systems  Constitutional: Negative.  Negative for chills and fever.  HENT: Positive for rhinorrhea.   Respiratory: Positive for cough. Negative for shortness of breath and wheezing.   Cardiovascular: Negative.  Negative for chest pain, palpitations and leg swelling.  Gastrointestinal: Negative.   Allergic/Immunologic: Negative.   Neurological: Negative.   Psychiatric/Behavioral: Negative.        Physical Exam  BP 134/78 (BP Location: Left Arm, Patient Position: Sitting, Cuff Size: Normal)   Pulse 90   Ht 5' 2.6" (1.59 m)   Wt 233 lb 12.8 oz (106.1 kg)   SpO2 99%   BMI 41.95 kg/m   Wt Readings from Last 5 Encounters:  09/02/18 233 lb 12.8 oz (106.1 kg)  08/20/18 233 lb (105.7 kg)  07/05/18 225 lb 6.4 oz (102.2 kg)  06/25/18 225 lb (102.1 kg)  05/20/18 232 lb (105.2 kg)     Physical Exam  Constitutional: She is oriented to person, place, and time. She appears well-developed and well-nourished. No distress.  Cardiovascular: Normal rate and regular rhythm.  Pulmonary/Chest: Effort normal and breath sounds normal. No respiratory distress. She has no wheezes.  Neurological: She is alert and oriented to person, place, and time.  Psychiatric: She has a normal mood and affect.  Nursing note and vitals reviewed.      Assessment & Plan:   Chronic cough Chronic cough - patient advised to take her allergy and reflux medications Needed refill on Symbicort  Patient Instructions  Will reorder Symbicort Will order Prilosec Still smoking, but has cut back - please quit smoking Continue all other medications Keep upcoming visit with Dr. Tonia Brooms       Moderate persistent asthma without complication Patient Instructions  Will reorder Symbicort Will order Prilosec Still smoking, but has cut back - please quit smoking Continue all other medications Keep upcoming visit with Dr. Stephannie Peters, NP 09/04/2018

## 2018-09-02 NOTE — Patient Instructions (Addendum)
Will reorder Symbicort Will order Prilosec Still smoking, but has cut back - please quit smoking Continue all other medications Keep upcoming visit with Dr. Tonia BroomsIcard

## 2018-09-04 ENCOUNTER — Telehealth: Payer: Self-pay | Admitting: Nurse Practitioner

## 2018-09-04 ENCOUNTER — Encounter: Payer: Self-pay | Admitting: Nurse Practitioner

## 2018-09-04 MED ORDER — BUDESONIDE-FORMOTEROL FUMARATE 160-4.5 MCG/ACT IN AERO: 2.0000 | INHALATION_SPRAY | Freq: Two times a day (BID) | RESPIRATORY_TRACT | 0 refills | Status: DC

## 2018-09-04 NOTE — Assessment & Plan Note (Signed)
Chronic cough - patient advised to take her allergy and reflux medications Needed refill on Symbicort  Patient Instructions  Will reorder Symbicort Will order Prilosec Still smoking, but has cut back - please quit smoking Continue all other medications Keep upcoming visit with Dr. Tonia BroomsIcard

## 2018-09-04 NOTE — Telephone Encounter (Signed)
Called and spoke with patient, she is requesting a refill of her symbicort. Refill sent. Nothing further needed.

## 2018-09-04 NOTE — Progress Notes (Signed)
PCCM: Agree. Thanks for seeing her.  Josephine IgoBradley L Icard, DO [facility] 09/04/2018 12:43 PM

## 2018-09-04 NOTE — Assessment & Plan Note (Signed)
Patient Instructions  Will reorder Symbicort Will order Prilosec Still smoking, but has cut back - please quit smoking Continue all other medications Keep upcoming visit with Dr. Tonia BroomsIcard

## 2018-09-24 ENCOUNTER — Ambulatory Visit: Payer: PRIVATE HEALTH INSURANCE | Admitting: Nurse Practitioner

## 2018-09-24 ENCOUNTER — Other Ambulatory Visit: Payer: Self-pay | Admitting: Family Medicine

## 2018-09-26 ENCOUNTER — Encounter: Payer: Self-pay | Admitting: Pulmonary Disease

## 2018-09-26 ENCOUNTER — Encounter: Payer: Self-pay | Admitting: *Deleted

## 2018-09-26 ENCOUNTER — Ambulatory Visit (INDEPENDENT_AMBULATORY_CARE_PROVIDER_SITE_OTHER): Payer: BLUE CROSS/BLUE SHIELD | Admitting: Pulmonary Disease

## 2018-09-26 ENCOUNTER — Encounter: Payer: Self-pay | Admitting: Nurse Practitioner

## 2018-09-26 ENCOUNTER — Telehealth: Payer: Self-pay | Admitting: Pulmonary Disease

## 2018-09-26 ENCOUNTER — Ambulatory Visit (INDEPENDENT_AMBULATORY_CARE_PROVIDER_SITE_OTHER): Payer: BLUE CROSS/BLUE SHIELD | Admitting: Nurse Practitioner

## 2018-09-26 VITALS — BP 132/88 | HR 89 | Temp 98.6°F | Ht 62.6 in | Wt 232.0 lb

## 2018-09-26 VITALS — BP 128/82 | HR 75 | Ht 62.0 in | Wt 231.8 lb

## 2018-09-26 DIAGNOSIS — R053 Chronic cough: Secondary | ICD-10-CM

## 2018-09-26 DIAGNOSIS — R05 Cough: Secondary | ICD-10-CM

## 2018-09-26 DIAGNOSIS — Z6841 Body Mass Index (BMI) 40.0 and over, adult: Secondary | ICD-10-CM

## 2018-09-26 DIAGNOSIS — J4540 Moderate persistent asthma, uncomplicated: Secondary | ICD-10-CM

## 2018-09-26 DIAGNOSIS — I10 Essential (primary) hypertension: Secondary | ICD-10-CM

## 2018-09-26 LAB — PULMONARY FUNCTION TEST
DL/VA % pred: 104 %
DL/VA: 4.72 ml/min/mmHg/L
DLCO unc % pred: 60 %
DLCO unc: 13.11 ml/min/mmHg
FEF 25-75 Post: 3.84 L/sec
FEF 25-75 Pre: 3.64 L/sec
FEF2575-%Change-Post: 5 %
FEF2575-%Pred-Post: 140 %
FEF2575-%Pred-Pre: 133 %
FEV1-%Change-Post: 27 %
FEV1-%Pred-Post: 81 %
FEV1-%Pred-Pre: 63 %
FEV1-Post: 1.95 L
FEV1-Pre: 1.52 L
FEV1FVC-%Change-Post: 0 %
FEV1FVC-%Pred-Pre: 117 %
FEV6-%Change-Post: 26 %
FEV6-%Pred-Post: 69 %
FEV6-%Pred-Pre: 55 %
FEV6-Post: 1.97 L
FEV6-Pre: 1.55 L
FEV6FVC-%Pred-Post: 102 %
FEV6FVC-%Pred-Pre: 102 %
FVC-%CHANGE-POST: 26 %
FVC-%Pred-Post: 68 %
FVC-%Pred-Pre: 54 %
FVC-Post: 1.97 L
FVC-Pre: 1.55 L
PRE FEV6/FVC RATIO: 100 %
Post FEV1/FVC ratio: 99 %
Post FEV6/FVC ratio: 100 %
Pre FEV1/FVC ratio: 98 %
RV % pred: 112 %
RV: 1.64 L
TLC % pred: 73 %
TLC: 3.49 L

## 2018-09-26 MED ORDER — ALBUTEROL SULFATE (2.5 MG/3ML) 0.083% IN NEBU: 2.5000 mg | INHALATION_SOLUTION | RESPIRATORY_TRACT | 3 refills | Status: AC | PRN

## 2018-09-26 MED ORDER — BUDESONIDE-FORMOTEROL FUMARATE 160-4.5 MCG/ACT IN AERO: 2.0000 | INHALATION_SPRAY | Freq: Two times a day (BID) | RESPIRATORY_TRACT | 0 refills | Status: DC

## 2018-09-26 MED ORDER — VITAMIN D 50 MCG (2000 UT) PO CAPS: 2000.0000 [IU] | ORAL_CAPSULE | Freq: Every day | ORAL | 5 refills | Status: AC

## 2018-09-26 MED ORDER — ALBUTEROL SULFATE HFA 108 (90 BASE) MCG/ACT IN AERS: 1.0000 | INHALATION_SPRAY | Freq: Four times a day (QID) | RESPIRATORY_TRACT | 3 refills | Status: AC | PRN

## 2018-09-26 MED ORDER — LORCASERIN HCL ER 20 MG PO TB24: 1.0000 | ORAL_TABLET | Freq: Every day | ORAL | 0 refills | Status: DC

## 2018-09-26 MED ORDER — BUDESONIDE-FORMOTEROL FUMARATE 160-4.5 MCG/ACT IN AERO: 2.0000 | INHALATION_SPRAY | Freq: Two times a day (BID) | RESPIRATORY_TRACT | 3 refills | Status: DC

## 2018-09-26 NOTE — Progress Notes (Signed)
Subjective:  Patient ID: Emily Salazar, female    DOB: [birth year]  Age: 37 y.o. MRN: [id number]  CC: Follow-up (3 mo fu/BP and weight loss consult. )   HPI HTN: Improved with amlodipine and lisinopril. BP Readings from Last 3 Encounters:  09/26/18 128/82  09/26/18 132/88  09/02/18 134/78   She will also like to discuss weight loss medication. She has made changes to diet: low fat/low carb and decrease portion size x283months. She also maintains >10000steps per day by walking. Continues to wean off cigarettes: 5cig per day. Does not want to use nicotine patch nor chnatix at this time. No significant weight loss with above lifestyle changes. Due to HTN she is not a candidate to use phentermine.  Reviewed past Medical, Social and Family history today.  Outpatient Medications Prior to Visit  Medication Sig Dispense Refill  . Acetaminophen (TYLENOL 8 HOUR PO) Take by mouth.    Marland Kitchen. amLODipine (NORVASC) 5 MG tablet Take 1 tablet (5 mg total) by mouth at bedtime. 90 tablet 3  . benzonatate (TESSALON) 100 MG capsule Take 1 capsule (100 mg total) by mouth 3 (three) times daily. 20 capsule 0  . cetirizine (ZYRTEC) 10 MG tablet Take 1 tablet (10 mg total) by mouth at bedtime. 30 tablet 3  . diphenoxylate-atropine (LOMOTIL) 2.5-0.025 MG tablet Take 1 tablet by mouth 4 (four) times daily as needed for diarrhea or loose stools. 10 tablet 0  . fluticasone (FLONASE) 50 MCG/ACT nasal spray Place 2 sprays into both nostrils daily. 16 g 0  . gabapentin (NEURONTIN) 300 MG capsule TAKE 1 CAPSULE BY MOUTH EVERY NIGHT AT BEDTIME 30 capsule 3  . lisinopril (PRINIVIL,ZESTRIL) 20 MG tablet Take 1 tablet (20 mg total) by mouth daily. 90 tablet 3  . montelukast (SINGULAIR) 10 MG tablet Take 1 tablet (10 mg total) by mouth at bedtime. 30 tablet 3  . NON FORMULARY OTC for allergy    . omeprazole (PRILOSEC) 20 MG capsule Take 1 capsule (20 mg total) by mouth daily. 30 capsule 11  . Spacer/Aero-Holding Chambers  (AEROCHAMBER MV) inhaler Use as instructed 1 each 0  . triamcinolone cream (KENALOG) 0.1 % Apply 1 application topically 2 (two) times daily. 30 g 0  . albuterol (PROAIR HFA) 108 (90 Base) MCG/ACT inhaler Inhale 1-2 puffs into the lungs every 6 (six) hours as needed for wheezing or shortness of breath. 1 Inhaler 1  . albuterol (PROVENTIL) (2.5 MG/3ML) 0.083% nebulizer solution Take 3 mLs (2.5 mg total) by nebulization every 4 (four) hours as needed for wheezing or shortness of breath. 360 mL 5  . Cholecalciferol (VITAMIN D) 2000 units CAPS Take 1 capsule (2,000 Units total) by mouth daily. 30 capsule 5  . budesonide-formoterol (SYMBICORT) 160-4.5 MCG/ACT inhaler Inhale 2 puffs into the lungs 2 (two) times daily for 1 day. 1 Inhaler 0   Facility-Administered Medications Prior to Visit  Medication Dose Route Frequency Provider Last Rate Last Dose  . ipratropium-albuterol (DUONEB) 0.5-2.5 (3) MG/3ML nebulizer solution 3 mL  3 mL Nebulization Q6H Jakobie Henslee, Bonna Gainsharlotte Lum, NP   3 mL at 06/25/18 1359    ROS See HPI  Objective:  BP 132/88   Pulse 89   Temp 98.6 F (37 C) (Oral)   Ht 5' 2.6" (1.59 m)   Wt 232 lb (105.2 kg)   SpO2 97%   BMI 41.62 kg/m   BP Readings from Last 3 Encounters:  09/26/18 128/82  09/26/18 132/88  09/02/18 134/78   Wt Readings from  Last 3 Encounters:  09/26/18 231 lb 12.8 oz (105.1 kg)  09/26/18 232 lb (105.2 kg)  09/02/18 233 lb 12.8 oz (106.1 kg)    Physical Exam Vitals signs reviewed.  Constitutional:      Appearance: She is obese.  Cardiovascular:     Rate and Rhythm: Normal rate and regular rhythm.     Heart sounds: Normal heart sounds.  Pulmonary:     Effort: Pulmonary effort is normal.     Breath sounds: Normal breath sounds.  Musculoskeletal:     Right lower leg: No edema.     Left lower leg: No edema.  Neurological:     General: No focal deficit present.     Mental Status: She is alert and oriented to person, place, and time.  Psychiatric:          Mood and Affect: Mood normal.        Behavior: Behavior normal.        Thought Content: Thought content normal.        Judgment: Judgment normal.     Lab Results  Component Value Date   WBC 7.0 07/05/2018   HGB 11.0 (L) 07/05/2018   HCT 33.6 (L) 07/05/2018   PLT 334.0 07/05/2018   GLUCOSE 84 05/20/2018   ALT 29 01/04/2018   AST 23 01/04/2018   NA 139 05/20/2018   K 3.7 05/20/2018   CL 108 05/20/2018   CREATININE 0.86 05/20/2018   BUN 9 05/20/2018   CO2 25 05/20/2018   TSH 0.68 05/20/2018   HGBA1C 5.7 05/20/2018    Assessment & Plan:   Irene LimboKizzy was seen today for follow-up.  Diagnoses and all orders for this visit:  Class 3 severe obesity with serious comorbidity and body mass index (BMI) of 40.0 to 44.9 in adult, unspecified obesity type (HCC) -     Amb Ref to Medical Weight Management -     Lorcaserin HCl ER (BELVIQ XR) 20 MG TB24; Take 1 tablet by mouth daily after breakfast.  Essential hypertension   Consider use of Wellbutrin and Metformin if no significant weight loss in 3months.  I am having Alleyah Gurganus start on Lorcaserin HCl ER. I am also having her maintain her Acetaminophen (TYLENOL 8 HOUR PO), NON FORMULARY, triamcinolone cream, lisinopril, amLODipine, fluticasone, montelukast, cetirizine, AEROCHAMBER MV, diphenoxylate-atropine, omeprazole, benzonatate, and gabapentin. We will continue to administer ipratropium-albuterol.  Meds ordered this encounter  Medications  . Lorcaserin HCl ER (BELVIQ XR) 20 MG TB24    Sig: Take 1 tablet by mouth daily after breakfast.    Dispense:  30 tablet    Refill:  0    Order Specific Question:   Supervising Provider    Answer:   Dianne DunARON, TALIA M [[id number]]    Problem List Items Addressed This Visit      Cardiovascular and Mediastinum   HTN (hypertension)     Other   Morbid obesity due to excess calories (HCC) - Primary   Relevant Medications   Lorcaserin HCl ER (BELVIQ XR) 20 MG TB24      Follow-up: Return in about 4  weeks (around 10/24/2018) for CPE (fasting) and weight loss re eval.  Alysia Pennaharlotte Keshona Kartes, NP

## 2018-09-26 NOTE — Patient Instructions (Addendum)
You will be contacted to schedule appt with weight loss clinic.  Calorie Counting for Weight Loss Calories are units of energy. Your body needs a certain amount of calories from food to keep you going throughout the day. When you eat more calories than your body needs, your body stores the extra calories as fat. When you eat fewer calories than your body needs, your body burns fat to get the energy it needs. Calorie counting means keeping track of how many calories you eat and drink each day. Calorie counting can be helpful if you need to lose weight. If you make sure to eat fewer calories than your body needs, you should lose weight. Ask your health care provider what a healthy weight is for you. For calorie counting to work, you will need to eat the right number of calories in a day in order to lose a healthy amount of weight per week. A dietitian can help you determine how many calories you need in a day and will give you suggestions on how to reach your calorie goal.  A healthy amount of weight to lose per week is usually 1-2 lb (0.5-0.9 kg). This usually means that your daily calorie intake should be reduced by 500-750 calories.  Eating 1,200 - 1,500 calories per day can help most women lose weight.  Eating 1,500 - 1,800 calories per day can help most men lose weight. What is my plan? My goal is to have ___1800_______ calories per day. If I have this many calories per day, I should lose around __________ pounds per week. What do I need to know about calorie counting? In order to meet your daily calorie goal, you will need to:  Find out how many calories are in each food you would like to eat. Try to do this before you eat.  Decide how much of the food you plan to eat.  Write down what you ate and how many calories it had. Doing this is called keeping a food log. To successfully lose weight, it is important to balance calorie counting with a healthy lifestyle that includes regular  activity. Aim for 150 minutes of moderate exercise (such as walking) or 75 minutes of vigorous exercise (such as running) each week. Where do I find calorie information?  The number of calories in a food can be found on a Nutrition Facts label. If a food does not have a Nutrition Facts label, try to look up the calories online or ask your dietitian for help. Remember that calories are listed per serving. If you choose to have more than one serving of a food, you will have to multiply the calories per serving by the amount of servings you plan to eat. For example, the label on a package of bread might say that a serving size is 1 slice and that there are 90 calories in a serving. If you eat 1 slice, you will have eaten 90 calories. If you eat 2 slices, you will have eaten 180 calories. How do I keep a food log? Immediately after each meal, record the following information in your food log:  What you ate. Don't forget to include toppings, sauces, and other extras on the food.  How much you ate. This can be measured in cups, ounces, or number of items.  How many calories each food and drink had.  The total number of calories in the meal. Keep your food log near you, such as in a small notebook in your  pocket, or use a mobile app or website. Some programs will calculate calories for you and show you how many calories you have left for the day to meet your goal. What are some calorie counting tips?   Use your calories on foods and drinks that will fill you up and not leave you hungry: ? Some examples of foods that fill you up are nuts and nut butters, vegetables, lean proteins, and high-fiber foods like whole grains. High-fiber foods are foods with more than 5 g fiber per serving. ? Drinks such as sodas, specialty coffee drinks, alcohol, and juices have a lot of calories, yet do not fill you up.  Eat nutritious foods and avoid empty calories. Empty calories are calories you get from foods or  beverages that do not have many vitamins or protein, such as candy, sweets, and soda. It is better to have a nutritious high-calorie food (such as an avocado) than a food with few nutrients (such as a bag of chips).  Know how many calories are in the foods you eat most often. This will help you calculate calorie counts faster.  Pay attention to calories in drinks. Low-calorie drinks include water and unsweetened drinks.  Pay attention to nutrition labels for "low fat" or "fat free" foods. These foods sometimes have the same amount of calories or more calories than the full fat versions. They also often have added sugar, starch, or salt, to make up for flavor that was removed with the fat.  Find a way of tracking calories that works for you. Get creative. Try different apps or programs if writing down calories does not work for you. What are some portion control tips?  Know how many calories are in a serving. This will help you know how many servings of a certain food you can have.  Use a measuring cup to measure serving sizes. You could also try weighing out portions on a kitchen scale. With time, you will be able to estimate serving sizes for some foods.  Take some time to put servings of different foods on your favorite plates, bowls, and cups so you know what a serving looks like.  Try not to eat straight from a bag or box. Doing this can lead to overeating. Put the amount you would like to eat in a cup or on a plate to make sure you are eating the right portion.  Use smaller plates, glasses, and bowls to prevent overeating.  Try not to multitask (for example, watch TV or use your computer) while eating. If it is time to eat, sit down at a table and enjoy your food. This will help you to know when you are full. It will also help you to be aware of what you are eating and how much you are eating. What are tips for following this plan? Reading food labels  Check the calorie count compared  to the serving size. The serving size may be smaller than what you are used to eating.  Check the source of the calories. Make sure the food you are eating is high in vitamins and protein and low in saturated and trans fats. Shopping  Read nutrition labels while you shop. This will help you make healthy decisions before you decide to purchase your food.  Make a grocery list and stick to it. Cooking  Try to cook your favorite foods in a healthier way. For example, try baking instead of frying.  Use low-fat dairy products. Meal planning  Use more fruits and vegetables. Half of your plate should be fruits and vegetables.  Include lean proteins like poultry and fish. How do I count calories when eating out?  Ask for smaller portion sizes.  Consider sharing an entree and sides instead of getting your own entree.  If you get your own entree, eat only half. Ask for a box at the beginning of your meal and put the rest of your entree in it so you are not tempted to eat it.  If calories are listed on the menu, choose the lower calorie options.  Choose dishes that include vegetables, fruits, whole grains, low-fat dairy products, and lean protein.  Choose items that are boiled, broiled, grilled, or steamed. Stay away from items that are buttered, battered, fried, or served with cream sauce. Items labeled "crispy" are usually fried, unless stated otherwise.  Choose water, low-fat milk, unsweetened iced tea, or other drinks without added sugar. If you want an alcoholic beverage, choose a lower calorie option such as a glass of wine or light beer.  Ask for dressings, sauces, and syrups on the side. These are usually high in calories, so you should limit the amount you eat.  If you want a salad, choose a garden salad and ask for grilled meats. Avoid extra toppings like bacon, cheese, or fried items. Ask for the dressing on the side, or ask for olive oil and vinegar or lemon to use as  dressing.  Estimate how many servings of a food you are given. For example, a serving of cooked rice is  cup or about the size of half a baseball. Knowing serving sizes will help you be aware of how much food you are eating at restaurants. The list below tells you how big or small some common portion sizes are based on everyday objects: ? 1 oz-4 stacked dice. ? 3 oz-1 deck of cards. ? 1 tsp-1 die. ? 1 Tbsp- a ping-pong ball. ? 2 Tbsp-1 ping-pong ball. ?  cup- baseball. ? 1 cup-1 baseball. Summary  Calorie counting means keeping track of how many calories you eat and drink each day. If you eat fewer calories than your body needs, you should lose weight.  A healthy amount of weight to lose per week is usually 1-2 lb (0.5-0.9 kg). This usually means reducing your daily calorie intake by 500-750 calories.  The number of calories in a food can be found on a Nutrition Facts label. If a food does not have a Nutrition Facts label, try to look up the calories online or ask your dietitian for help.  Use your calories on foods and drinks that will fill you up, and not on foods and drinks that will leave you hungry.  Use smaller plates, glasses, and bowls to prevent overeating. This information is not intended to replace advice given to you by your health care provider. Make sure you discuss any questions you have with your health care provider. Document Released: 09/25/2005 Document Revised: 06/14/2018 Document Reviewed: 08/25/2016 [employer] Interactive Patient Education  2019 [employer]  Lorcaserin oral tablets What is this medicine? LORCASERIN (lor ca SER in) is used to promote and maintain weight loss in obese patients. This medicine should be used with a reduced calorie diet and, if appropriate, an exercise program. This medicine may be used for other purposes; ask your health care provider or pharmacist if you have questions. COMMON BRAND NAME(S): Belviq What should I tell my health  care provider before I take this  medicine? They need to know if you have any of these conditions: -anatomical deformation of the penis, Peyronie's disease, or history of priapism (painful and prolonged erection) -diabetes -heart disease -history of blood diseases, like sickle cell anemia or leukemia -history of irregular heartbeat -kidney disease -liver disease -suicidal thoughts, plans, or attempt; a previous suicide attempt by you or a family member -an unusual or allergic reaction to lorcaserin, other medicines, foods, dyes, or preservatives -pregnant or trying to get pregnant -breast-feeding How should I use this medicine? Take this medicine by mouth with a glass of water. Follow the directions on the prescription label. You can take it with or without food. Take your medicine at regular intervals. Do not take it more often than directed. Do not stop taking except on your doctor's advice. Talk to your pediatrician regarding the use of this medicine in children. Special care may be needed. Overdosage: If you think you have taken too much of this medicine contact a poison control center or emergency room at once. NOTE: This medicine is only for you. Do not share this medicine with others. What if I miss a dose? If you miss a dose, take it as soon as you can. If it is almost time for your next dose, take only that dose. Do not take double or extra doses. What may interact with this medicine? -cabergoline -certain medicines for depression, anxiety, or psychotic disturbances -certain medicines for erectile dysfunction -certain medicines for migraine headache like almotriptan, eletriptan, frovatriptan, naratriptan, rizatriptan, sumatriptan, zolmitriptan -dextromethorphan -linezolid -lithium -medicines for diabetes -other weight loss products -tramadol -St. John's Wort -stimulant medicines for attention disorders, weight loss, or to stay awake -tryptophan This list may not describe all  possible interactions. Give your health care provider a list of all the medicines, herbs, non-prescription drugs, or dietary supplements you use. Also tell them if you smoke, drink alcohol, or use illegal drugs. Some items may interact with your medicine. What should I watch for while using this medicine? This medicine is intended to be used in addition to a healthy diet and appropriate exercise. The best results are achieved this way. Your doctor should instruct you to stop taking this medicine if you do not lose a certain amount of weight within the first 12 weeks of treatment, but it is important that you do not change your dose in any way without consulting your doctor or health care professional. Visit your doctor or health care professional for regular checkups. Your doctor may order blood tests or other tests to see how you are doing. Do not drive, use machinery, or do anything that needs mental alertness until you know how this medicine affects you. This medicine may affect blood sugar levels. If you have diabetes, check with your doctor or health care professional before you change your diet or the dose of your diabetic medicine. Patients and their families should watch out for worsening depression or thoughts of suicide. Also watch out for sudden changes in feelings such as feeling anxious, agitated, panicky, irritable, hostile, aggressive, impulsive, severely restless, overly excited and hyperactive, or not being able to sleep. If this happens, especially at the beginning of treatment or after a change in dose, call your health care professional. Contact your doctor or health care professional right away if you are a man with an erection that lasts longer than 4 hours or if the erection becomes painful. This may be a sign of serious problem and must be treated right away to prevent  permanent damage. What side effects may I notice from receiving this medicine? Side effects that you should report to  your doctor or health care professional as soon as possible: -allergic reactions like skin rash, itching or hives, swelling of the face, lips, or tongue -abnormal production of milk -breast enlargement in both males and females -breathing problems -changes in emotions or moods -changes in vision -confusion -erection lasting more than 4 hours or a painful erection -fast or irregular heart beat -feeling faint or lightheaded, falls -fever or chills, sore throat -hallucination, loss of contact with reality -high or low blood pressure -menstrual changes -restlessness -slow or irregular heartbeat -stiff muscles -sweating -suicidal thoughts or other mood changes -swelling of the ankles, feet, hands -unusually weak or tired -vomiting Side effects that usually do not require medical attention (report to your doctor or health care professional if they continue or are bothersome): -back pain -constipation -cough -dry mouth -nausea -tiredness This list may not describe all possible side effects. Call your doctor for medical advice about side effects. You may report side effects to FDA at 1-800-FDA-1088. Where should I keep my medicine? Keep out of the reach of children. This medicine can be abused. Keep your medicine in a safe place to protect it from theft. Do not share this medicine with anyone. Selling or giving away this medicine is dangerous and against the law. Store at room temperature between 15 and 30 degrees C (59 and 86 degrees F). Throw away any unused medicine after the expiration date. NOTE: This sheet is a summary. It may not cover all possible information. If you have questions about this medicine, talk to your doctor, pharmacist, or health care provider.  2019 [employer]vier/[employer] Standard (2015-10-28 12:13:31)

## 2018-09-26 NOTE — Progress Notes (Signed)
Synopsis: Referred in September 2019 for cough by Anne NgNche, Charlotte Lum, NP  Subjective:   PATIENT ID: Emily Salazar DOB: 30-Jul-1981, MRN: [id number]  Chief Complaint  Patient presents with  . Follow-up    PFT done today. Dry cough has not improved. Denies chest pain or discomfort. States she started coughing up thick soft chunks of mucous.     Initial office visit: Pmh of asthma and chronic cough as well as severe allergies. And currently on several allergy medications. 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 She normally has a recurrent sinus infection symptoms at the change of every season. She occasionally has chest tightness. And recently had and exacerbation related to exposure to a strong scented air freshener. Currently taking using a combivent. She was on advair which caused a wet cough. She was given depomedrol shot and she started to feel better. Uses flonase for her seasonal allergies. Live with her dad. Small dog in the home. No cats or birds.   OV 09/26/2018: Since her last office visit she has had worsening nocturnal cough and sputum production with associated wheeze.  She has been unable to have her inhalers filled due to cost.  She did use the samples that were given.  She was seen recently by 1 of our nurse practitioners and new prescriptions were sent.  She has been taking her Zyrtec and Singulair regularly.  Unfortunately she is still smoking.  We discussed this at length today.  She has been able to cut down on smoking but has been doing this since age 37.  She saw her primary care doctor again this morning.  She does understand she needs to lose weight and has been discussing trying to work on her diet as well as potentially starting a appetite suppressant.  Patient had pulmonary function test completed prior to today's office visit which revealed a prebronchodilator FEV1 of 1.52 L, 63% predicted postbronchodilator response of 27%  change with 1.95 L, 81% predicted.  Both FVC and FEV1 are reduced, TLC 73% predicted with RV 112%, DLCO 60%.    Past Medical History:  Diagnosis Date  . ADHD    not taking med right now  . Asthma   . BMI 40.0-44.9, adult (HCC) 07/05/2018  . Depression    no med taking  . Hypertension   . Morbid obesity due to excess calories (HCC) 07/05/2018     Family History  Problem Relation Age of Onset  . Diabetes Mother   . Cancer Maternal Aunt        lung cancer  . Heart disease Maternal Uncle   . Cancer Maternal Grandmother        brain cancer  . Crohn's disease Cousin      Past Surgical History:  Procedure Laterality Date  . CESAREAN SECTION  2004 and 2007  . CHOLECYSTECTOMY  2007  . KIDNEY STONE SURGERY  2013   2 times  . NOSE SURGERY  1991   broken  . TUBAL LIGATION  2007    Social History   Socioeconomic History  . Marital status: Single    Spouse name: Not on file  . Number of children: 2  . Years of education: Not on file  . Highest education level: Not on file  Occupational History  . Occupation: assembly line    Comment: [employer]  Social Needs  . Financial resource strain: Not on file  . Food insecurity:    Worry:  Not on file    Inability: Not on file  . Transportation needs:    Medical: Not on file    Non-medical: Not on file  Tobacco Use  . Smoking status: Current Every Day Smoker    Packs/day: 0.25  . Smokeless tobacco: Never Used  . Tobacco comment: 5 cigarettes per day   Substance and Sexual Activity  . Alcohol use: Not Currently    Frequency: Never    Comment: rare  . Drug use: Yes    Frequency: 2.0 times per week    Types: Marijuana  . Sexual activity: Not Currently  Lifestyle  . Physical activity:    Days per week: Not on file    Minutes per session: Not on file  . Stress: Not on file  Relationships  . Social connections:    Talks on phone: Not on file    Gets together: Not on file    Attends religious service: Not on file     Active member of club or organization: Not on file    Attends meetings of clubs or organizations: Not on file    Relationship status: Not on file  . Intimate partner violence:    Fear of current or ex partner: Not on file    Emotionally abused: Not on file    Physically abused: Not on file    Forced sexual activity: Not on file  Other Topics Concern  . Not on file  Social History Narrative   Home with sister at this time.   Children in [place] (father has custody)    Incarcerated for 80yrs.   Released 07/2017 from [facility].     Allergies  Allergen Reactions  . Penicillins Swelling    Blister on lips,neck and shoulder. Ask first  . Sulfa Antibiotics Swelling     Outpatient Medications Prior to Visit  Medication Sig Dispense Refill  . Acetaminophen (TYLENOL 8 HOUR PO) Take by mouth.    Marland Kitchen albuterol (PROAIR HFA) 108 (90 Base) MCG/ACT inhaler Inhale 1-2 puffs into the lungs every 6 (six) hours as needed for wheezing or shortness of breath. 1 Inhaler 1  . albuterol (PROVENTIL) (2.5 MG/3ML) 0.083% nebulizer solution Take 3 mLs (2.5 mg total) by nebulization every 4 (four) hours as needed for wheezing or shortness of breath. 360 mL 5  . amLODipine (NORVASC) 5 MG tablet Take 1 tablet (5 mg total) by mouth at bedtime. 90 tablet 3  . benzonatate (TESSALON) 100 MG capsule Take 1 capsule (100 mg total) by mouth 3 (three) times daily. 20 capsule 0  . cetirizine (ZYRTEC) 10 MG tablet Take 1 tablet (10 mg total) by mouth at bedtime. 30 tablet 3  . Cholecalciferol (VITAMIN D) 2000 units CAPS Take 1 capsule (2,000 Units total) by mouth daily. 30 capsule 5  . diphenoxylate-atropine (LOMOTIL) 2.5-0.025 MG tablet Take 1 tablet by mouth 4 (four) times daily as needed for diarrhea or loose stools. 10 tablet 0  . fluticasone (FLONASE) 50 MCG/ACT nasal spray Place 2 sprays into both nostrils daily. 16 g 0  . gabapentin (NEURONTIN) 300 MG capsule TAKE 1 CAPSULE BY MOUTH EVERY NIGHT AT BEDTIME 30 capsule 3    . lisinopril (PRINIVIL,ZESTRIL) 20 MG tablet Take 1 tablet (20 mg total) by mouth daily. 90 tablet 3  . Lorcaserin HCl ER (BELVIQ XR) 20 MG TB24 Take 1 tablet by mouth daily after breakfast. 30 tablet 0  . montelukast (SINGULAIR) 10 MG tablet Take 1 tablet (10 mg total) by  mouth at bedtime. 30 tablet 3  . NON FORMULARY OTC for allergy    . omeprazole (PRILOSEC) 20 MG capsule Take 1 capsule (20 mg total) by mouth daily. 30 capsule 11  . Spacer/Aero-Holding Chambers (AEROCHAMBER MV) inhaler Use as instructed 1 each 0  . triamcinolone cream (KENALOG) 0.1 % Apply 1 application topically 2 (two) times daily. 30 g 0  . budesonide-formoterol (SYMBICORT) 160-4.5 MCG/ACT inhaler Inhale 2 puffs into the lungs 2 (two) times daily for 1 day. 1 Inhaler 0   Facility-Administered Medications Prior to Visit  Medication Dose Route Frequency Provider Last Rate Last Dose  . ipratropium-albuterol (DUONEB) 0.5-2.5 (3) MG/3ML nebulizer solution 3 mL  3 mL Nebulization Q6H Nche, Bonna Gainsharlotte Lum, NP   3 mL at 06/25/18 1359    Review of Systems  Constitutional: Negative for chills, fever, malaise/fatigue and weight loss.  HENT: Negative for hearing loss, sore throat and tinnitus.   Eyes: Negative for blurred vision and double vision.  Respiratory: Positive for cough, sputum production, shortness of breath and wheezing. Negative for hemoptysis and stridor.   Cardiovascular: Negative for chest pain, palpitations, orthopnea, leg swelling and PND.  Gastrointestinal: Negative for abdominal pain, constipation, diarrhea, heartburn, nausea and vomiting.  Genitourinary: Negative for dysuria, hematuria and urgency.  Musculoskeletal: Negative for joint pain and myalgias.  Skin: Negative for itching and rash.  Neurological: Negative for dizziness, tingling, weakness and headaches.  Endo/Heme/Allergies: Negative for environmental allergies. Does not bruise/bleed easily.  Psychiatric/Behavioral: Negative for depression. The  patient is not nervous/anxious and does not have insomnia.   All other systems reviewed and are negative.    Objective:  Physical Exam Vitals signs reviewed.  Constitutional:      General: She is not in acute distress.    Appearance: She is well-developed.  HENT:     Head: Normocephalic and atraumatic.  Eyes:     General: No scleral icterus.    Conjunctiva/sclera: Conjunctivae normal.     Pupils: Pupils are equal, round, and reactive to light.  Neck:     Musculoskeletal: Neck supple.     Vascular: No JVD.     Trachea: No tracheal deviation.  Cardiovascular:     Rate and Rhythm: Normal rate and regular rhythm.     Heart sounds: Normal heart sounds. No murmur.  Pulmonary:     Effort: Pulmonary effort is normal. No tachypnea, accessory muscle usage or respiratory distress.     Breath sounds: No stridor. No wheezing, rhonchi or rales.     Comments: Bilateral bronchial breath sounds Abdominal:     General: Bowel sounds are normal. There is no distension.     Palpations: Abdomen is soft.     Tenderness: There is no abdominal tenderness.     Comments: Obese abdomen  Musculoskeletal:        General: No tenderness.  Lymphadenopathy:     Cervical: No cervical adenopathy.  Skin:    General: Skin is warm and dry.     Capillary Refill: Capillary refill takes less than 2 seconds.     Findings: No rash.  Neurological:     Mental Status: She is alert and oriented to person, place, and time.  Psychiatric:        Behavior: Behavior normal.      Vitals:   09/26/18 1336  BP: 128/82  Pulse: 75  SpO2: 98%  Weight: 231 lb 12.8 oz (105.1 kg)  Height: 5\' 2"  (1.575 m)   98% on  RA BMI Readings  from Last 3 Encounters:  09/26/18 42.40 kg/m  09/26/18 41.62 kg/m  09/02/18 41.95 kg/m   Wt Readings from Last 3 Encounters:  09/26/18 231 lb 12.8 oz (105.1 kg)  09/26/18 232 lb (105.2 kg)  09/02/18 233 lb 12.8 oz (106.1 kg)     CBC    Component Value Date/Time   WBC 7.0  07/05/2018 1459   RBC 4.14 07/05/2018 1459   HGB 11.0 (L) 07/05/2018 1459   HCT 33.6 (L) 07/05/2018 1459   PLT 334.0 07/05/2018 1459   MCV 81.2 07/05/2018 1459   MCH 24.0 (L) 01/04/2018 1412   MCHC 32.9 07/05/2018 1459   RDW 15.5 07/05/2018 1459   LYMPHSABS 2.5 07/05/2018 1459   MONOABS 0.4 07/05/2018 1459   EOSABS 0.2 07/05/2018 1459   BASOSABS 0.0 07/05/2018 1459   RAST: Negative IgE: 20  Chest Imaging: 06/25/2018 chest x-ray: No active cardia pulmonary process The patient's images have been independently reviewed by me.    Pulmonary Functions Testing Results:  PFT Results Latest Ref Rng & Units 09/26/2018  FVC-Pre L 1.55  FVC-Predicted Pre % 54  FVC-Post L 1.97  FVC-Predicted Post % 68  Pre FEV1/FVC % % 98  Post FEV1/FCV % % 99  FEV1-Pre L 1.52  FEV1-Predicted Pre % 63  FEV1-Post L 1.95  DLCO UNC% % 60  DLCO COR %Predicted % 104  TLC L 3.49  TLC % Predicted % 73  RV % Predicted % 112     FeNO: None   Pathology: None   Echocardiogram: None   Heart Catheterization: None     Assessment & Plan:   Moderate persistent asthma without complication  BMI 40.0-44.9, adult (HCC)  Chronic cough  Discussion:  This is a 37 year old with uncontrolled moderate persistent asthma.  She does have a history of significant allergies.  Prior RAST panel was negative with a low IgE of 20.  I suspect most of her symptoms are related to obesity.  Reflux I believe is a exacerbate her for her.  She may have sleep apnea but currently denies issues with sleep.  Her lung function test reveal a significant bronchodilator response, 27% improvement which is suggestive of reversible airway constriction consistent with a diagnosis of asthma.  The FEV1 and FVC values are reduced which I suspect she has some restriction likely due to her weight and habitus  As for her reduced DLCO we can revisit the importance potentially of obtaining CAT scan imaging of the chest.  She has no other  significant etiology of ILD.  She is anemic which could cause this.  We will recommend the following: Greater than 10 minutes of this patient's office visit was spent reviewing her pulmonary function tests that were completed today. Patient should continue her Flonase, Singulair She should also continue her vitamin D supplementation. We will also send new prescriptions for all of her inhalers to include albuterol HFA as needed shortness of breath and wheezing as well as albuterol nebulized solution and a new order for DME supply to help pay for nebulizer machine. We will give prescription of Symbicort as well as a coupon to help with financial cost. She must quit smoking. Patient was counseled on weight loss and its important in management of her chronic lung disease.  Greater than 50% of this patient's 40-minute office visit was spent face-to-face counseling on the above recommendations and treatment plan as well as review of the patient's pulmonary function test that were completed prior to today's office visit.  Current Outpatient Medications:  .  Acetaminophen (TYLENOL 8 HOUR PO), Take by mouth., Disp: , Rfl:  .  albuterol (PROAIR HFA) 108 (90 Base) MCG/ACT inhaler, Inhale 1-2 puffs into the lungs every 6 (six) hours as needed for wheezing or shortness of breath., Disp: 1 Inhaler, Rfl: 1 .  albuterol (PROVENTIL) (2.5 MG/3ML) 0.083% nebulizer solution, Take 3 mLs (2.5 mg total) by nebulization every 4 (four) hours as needed for wheezing or shortness of breath., Disp: 360 mL, Rfl: 5 .  amLODipine (NORVASC) 5 MG tablet, Take 1 tablet (5 mg total) by mouth at bedtime., Disp: 90 tablet, Rfl: 3 .  benzonatate (TESSALON) 100 MG capsule, Take 1 capsule (100 mg total) by mouth 3 (three) times daily., Disp: 20 capsule, Rfl: 0 .  cetirizine (ZYRTEC) 10 MG tablet, Take 1 tablet (10 mg total) by mouth at bedtime., Disp: 30 tablet, Rfl: 3 .  Cholecalciferol (VITAMIN D) 2000 units CAPS, Take 1 capsule  (2,000 Units total) by mouth daily., Disp: 30 capsule, Rfl: 5 .  diphenoxylate-atropine (LOMOTIL) 2.5-0.025 MG tablet, Take 1 tablet by mouth 4 (four) times daily as needed for diarrhea or loose stools., Disp: 10 tablet, Rfl: 0 .  fluticasone (FLONASE) 50 MCG/ACT nasal spray, Place 2 sprays into both nostrils daily., Disp: 16 g, Rfl: 0 .  gabapentin (NEURONTIN) 300 MG capsule, TAKE 1 CAPSULE BY MOUTH EVERY NIGHT AT BEDTIME, Disp: 30 capsule, Rfl: 3 .  lisinopril (PRINIVIL,ZESTRIL) 20 MG tablet, Take 1 tablet (20 mg total) by mouth daily., Disp: 90 tablet, Rfl: 3 .  Lorcaserin HCl ER (BELVIQ XR) 20 MG TB24, Take 1 tablet by mouth daily after breakfast., Disp: 30 tablet, Rfl: 0 .  montelukast (SINGULAIR) 10 MG tablet, Take 1 tablet (10 mg total) by mouth at bedtime., Disp: 30 tablet, Rfl: 3 .  NON FORMULARY, OTC for allergy, Disp: , Rfl:  .  omeprazole (PRILOSEC) 20 MG capsule, Take 1 capsule (20 mg total) by mouth daily., Disp: 30 capsule, Rfl: 11 .  Spacer/Aero-Holding Chambers (AEROCHAMBER MV) inhaler, Use as instructed, Disp: 1 each, Rfl: 0 .  triamcinolone cream (KENALOG) 0.1 %, Apply 1 application topically 2 (two) times daily., Disp: 30 g, Rfl: 0 .  budesonide-formoterol (SYMBICORT) 160-4.5 MCG/ACT inhaler, Inhale 2 puffs into the lungs 2 (two) times daily for 1 day., Disp: 1 Inhaler, Rfl: 0  Current Facility-Administered Medications:  .  ipratropium-albuterol (DUONEB) 0.5-2.5 (3) MG/3ML nebulizer solution 3 mL, 3 mL, Nebulization, Q6H, Nche, Bonna Gains, NP, 3 mL at 06/25/18 1359   Emily Igo, DO [facility] 09/26/2018 1:50 PM

## 2018-09-26 NOTE — Progress Notes (Signed)
PFT completed 09/26/18  

## 2018-09-26 NOTE — Patient Instructions (Addendum)
Thank you for visiting Dr. Tonia BroomsIcard at [facility]. Today we recommend the following: Orders Placed This Encounter  Procedures  . Ambulatory Referral for DME   Meds ordered this encounter  Medications  . albuterol (PROAIR HFA) 108 (90 Base) MCG/ACT inhaler    Sig: Inhale 1-2 puffs into the lungs every 6 (six) hours as needed for wheezing or shortness of breath.    Dispense:  1 Inhaler    Refill:  3  . albuterol (PROVENTIL) (2.5 MG/3ML) 0.083% nebulizer solution    Sig: Take 3 mLs (2.5 mg total) by nebulization every 4 (four) hours as needed for wheezing or shortness of breath.    Dispense:  75 mL    Refill:  3  . budesonide-formoterol (SYMBICORT) 160-4.5 MCG/ACT inhaler    Sig: Inhale 2 puffs into the lungs 2 (two) times daily for 1 day.    Dispense:  1 Inhaler    Refill:  3    Order Specific Question:   Quantity    Answer:   1   Return in about 2 months (around 11/27/2018).

## 2018-09-26 NOTE — Telephone Encounter (Signed)
Work note has been given to pt.  Nothing further is needed.

## 2018-10-01 ENCOUNTER — Telehealth: Payer: Self-pay | Admitting: Nurse Practitioner

## 2018-10-01 DIAGNOSIS — Z6841 Body Mass Index (BMI) 40.0 and over, adult: Principal | ICD-10-CM

## 2018-10-01 NOTE — Telephone Encounter (Signed)
PA for Belviq started, unable to complete PA because this medication is not cover by the pt's plan.   Called pharmacy for alternative medications, pharmacy stated their system do not show alternative.   Please advise.

## 2018-10-03 ENCOUNTER — Telehealth: Payer: Self-pay | Admitting: Nurse Practitioner

## 2018-10-03 DIAGNOSIS — M778 Other enthesopathies, not elsewhere classified: Secondary | ICD-10-CM

## 2018-10-03 DIAGNOSIS — M79641 Pain in right hand: Secondary | ICD-10-CM

## 2018-10-03 DIAGNOSIS — M79642 Pain in left hand: Secondary | ICD-10-CM

## 2018-10-03 MED ORDER — PHENTERMINE HCL 15 MG PO CAPS: 15.0000 mg | ORAL_CAPSULE | ORAL | 0 refills | Status: DC

## 2018-10-03 MED ORDER — GABAPENTIN 300 MG PO CAPS: 300.0000 mg | ORAL_CAPSULE | Freq: Three times a day (TID) | ORAL | 2 refills | Status: DC

## 2018-10-03 NOTE — Telephone Encounter (Signed)
Pt is aware of this, 2 wk f/u made.

## 2018-10-03 NOTE — Telephone Encounter (Signed)
Pt request refill or increase the dosage of gabapentin 300 mg cap. Pt saw Dr. 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   Pt was wondering if you can increase the dosage because right now is using 2-3 cap daily. Pt stated she is using a lot because she has to work in the line at her company during busy season but she normally supervise on regular basis (using 2-3 cap is just for short term). Pt has about 10 pill left.    Pt has an appt to see you on 10/17/2018. Do you wan to see her sooner for this? Please advise.

## 2018-10-03 NOTE — Telephone Encounter (Signed)
pt vm for the pt to call back. Need to inform message below.

## 2018-10-04 NOTE — Telephone Encounter (Signed)
Pt is aware of message below.

## 2018-10-11 ENCOUNTER — Encounter: Payer: Self-pay | Admitting: Nurse Practitioner

## 2018-10-17 ENCOUNTER — Encounter: Payer: Self-pay | Admitting: Nurse Practitioner

## 2018-10-17 ENCOUNTER — Ambulatory Visit: Payer: BLUE CROSS/BLUE SHIELD | Admitting: Nurse Practitioner

## 2018-10-18 ENCOUNTER — Encounter: Payer: Self-pay | Admitting: Nurse Practitioner

## 2018-10-18 ENCOUNTER — Ambulatory Visit (INDEPENDENT_AMBULATORY_CARE_PROVIDER_SITE_OTHER): Payer: BLUE CROSS/BLUE SHIELD | Admitting: Nurse Practitioner

## 2018-10-18 ENCOUNTER — Ambulatory Visit: Payer: BLUE CROSS/BLUE SHIELD | Admitting: Nurse Practitioner

## 2018-10-18 VITALS — BP 124/90 | HR 95 | Temp 98.3°F | Ht 62.0 in | Wt 224.8 lb

## 2018-10-18 DIAGNOSIS — Z6841 Body Mass Index (BMI) 40.0 and over, adult: Secondary | ICD-10-CM | POA: Diagnosis not present

## 2018-10-18 DIAGNOSIS — Z7689 Persons encountering health services in other specified circumstances: Secondary | ICD-10-CM | POA: Diagnosis not present

## 2018-10-18 MED ORDER — PHENTERMINE HCL 15 MG PO CAPS: 15.0000 mg | ORAL_CAPSULE | ORAL | 1 refills | Status: AC

## 2018-10-18 NOTE — Progress Notes (Signed)
Subjective:  Patient ID: Emily Salazar, female    DOB: 04-Jun-1981  Age: 38 y.o. MRN: [id number]  CC: Follow-up (weight loss follow up/feel really hot and really cold at times,side effects of this med?)  HPI  Obesity: Use of phentermine x657month. Lost 7pounds. Report intermittent hot flashes especially with ETOH intake. Wt Readings from Last 3 Encounters:  10/18/18 224 lb 12.8 oz (102 kg)  09/26/18 231 lb 12.8 oz (105.1 kg)  09/26/18 232 lb (105.2 kg)   BP Readings from Last 3 Encounters:  10/18/18 124/90  09/26/18 128/82  09/26/18 132/88   Reviewed past Medical, Social and Family history today.  Outpatient Medications Prior to Visit  Medication Sig Dispense Refill  . Acetaminophen (TYLENOL 8 HOUR PO) Take by mouth.    Marland Kitchen. albuterol (PROAIR HFA) 108 (90 Base) MCG/ACT inhaler Inhale 1-2 puffs into the lungs every 6 (six) hours as needed for wheezing or shortness of breath. 1 Inhaler 3  . albuterol (PROVENTIL) (2.5 MG/3ML) 0.083% nebulizer solution Take 3 mLs (2.5 mg total) by nebulization every 4 (four) hours as needed for wheezing or shortness of breath. 75 mL 3  . amLODipine (NORVASC) 5 MG tablet Take 1 tablet (5 mg total) by mouth at bedtime. 90 tablet 3  . cetirizine (ZYRTEC) 10 MG tablet Take 1 tablet (10 mg total) by mouth at bedtime. 30 tablet 3  . Cholecalciferol (VITAMIN D) 50 MCG (2000 UT) CAPS Take 1 capsule (2,000 Units total) by mouth daily. 30 capsule 5  . fluticasone (FLONASE) 50 MCG/ACT nasal spray Place 2 sprays into both nostrils daily. 16 g 0  . gabapentin (NEURONTIN) 300 MG capsule Take 1 capsule (300 mg total) by mouth 3 (three) times daily. 90 capsule 2  . lisinopril (PRINIVIL,ZESTRIL) 20 MG tablet Take 1 tablet (20 mg total) by mouth daily. 90 tablet 3  . montelukast (SINGULAIR) 10 MG tablet Take 1 tablet (10 mg total) by mouth at bedtime. 30 tablet 3  . NON FORMULARY OTC for allergy    . omeprazole (PRILOSEC) 20 MG capsule Take 1 capsule (20 mg total) by mouth  daily. 30 capsule 11  . Spacer/Aero-Holding Chambers (AEROCHAMBER MV) inhaler Use as instructed 1 each 0  . triamcinolone cream (KENALOG) 0.1 % Apply 1 application topically 2 (two) times daily. 30 g 0  . benzonatate (TESSALON) 100 MG capsule Take 1 capsule (100 mg total) by mouth 3 (three) times daily. 20 capsule 0  . diphenoxylate-atropine (LOMOTIL) 2.5-0.025 MG tablet Take 1 tablet by mouth 4 (four) times daily as needed for diarrhea or loose stools. 10 tablet 0  . phentermine 15 MG capsule Take 1 capsule (15 mg total) by mouth every morning. 14 capsule 0  . budesonide-formoterol (SYMBICORT) 160-4.5 MCG/ACT inhaler Inhale 2 puffs into the lungs 2 (two) times daily for 1 day. 2 Inhaler 0   Facility-Administered Medications Prior to Visit  Medication Dose Route Frequency Provider Last Rate Last Dose  . ipratropium-albuterol (DUONEB) 0.5-2.5 (3) MG/3ML nebulizer solution 3 mL  3 mL Nebulization Q6H Nche, Bonna Gainsharlotte Lum, NP   3 mL at 06/25/18 1359    ROS See HPI  Objective:  BP 124/90   Pulse 95   Temp 98.3 F (36.8 C) (Oral)   Ht 5\' 2"  (1.575 m)   Wt 224 lb 12.8 oz (102 kg)   SpO2 95%   BMI 41.12 kg/m   BP Readings from Last 3 Encounters:  10/18/18 124/90  09/26/18 128/82  09/26/18 132/88    Wt  Readings from Last 3 Encounters:  10/18/18 224 lb 12.8 oz (102 kg)  09/26/18 231 lb 12.8 oz (105.1 kg)  09/26/18 232 lb (105.2 kg)    Physical Exam Vitals signs reviewed.  Cardiovascular:     Rate and Rhythm: Normal rate and regular rhythm.  Pulmonary:     Effort: Pulmonary effort is normal.     Breath sounds: Normal breath sounds.  Musculoskeletal:     Right lower leg: No edema.     Left lower leg: No edema.  Neurological:     Mental Status: She is alert.  Psychiatric:        Mood and Affect: Mood normal.        Behavior: Behavior normal.        Thought Content: Thought content normal.     Lab Results  Component Value Date   WBC 7.0 07/05/2018   HGB 11.0 (L)  07/05/2018   HCT 33.6 (L) 07/05/2018   PLT 334.0 07/05/2018   GLUCOSE 84 05/20/2018   ALT 29 01/04/2018   AST 23 01/04/2018   NA 139 05/20/2018   K 3.7 05/20/2018   CL 108 05/20/2018   CREATININE 0.86 05/20/2018   BUN 9 05/20/2018   CO2 25 05/20/2018   TSH 0.68 05/20/2018   HGBA1C 5.7 05/20/2018    Assessment & Plan:   [name] was seen today for follow-up.  Diagnoses and all orders for this visit:  Encounter for weight management  Class 3 severe obesity with serious comorbidity and body mass index (BMI) of 40.0 to 44.9 in adult, unspecified obesity type (HCC) -     phentermine 15 MG capsule; Take 1 capsule (15 mg total) by mouth every morning.   I have discontinued Jet Steveson's diphenoxylate-atropine, benzonatate, and budesonide-formoterol. I am also having her maintain her Acetaminophen (TYLENOL 8 HOUR PO), NON FORMULARY, triamcinolone cream, lisinopril, amLODipine, fluticasone, montelukast, cetirizine, AEROCHAMBER MV, omeprazole, albuterol, albuterol, Vitamin D, gabapentin, and phentermine. We will continue to administer ipratropium-albuterol.  Meds ordered this encounter  Medications  . phentermine 15 MG capsule    Sig: Take 1 capsule (15 mg total) by mouth every morning.    Dispense:  30 capsule    Refill:  1    Order Specific Question:   Supervising Provider    Answer:   MATTHEWS, CODY [[id number]]    Problem List Items Addressed This Visit    None    Visit Diagnoses    Encounter for weight management    -  Primary   Class 3 severe obesity with serious comorbidity and body mass index (BMI) of 40.0 to 44.9 in adult, unspecified obesity type (HCC)       Relevant Medications   phentermine 15 MG capsule       Follow-up: Return in about 6 weeks (around 11/27/2018) for CPE (fasting) and weight check.  Emily Penna, NP

## 2018-10-18 NOTE — Patient Instructions (Addendum)
Ok to reschedule upcoming appt to 11/27/2017.  Check BP 1-2x/week Call office if BP>150/80.  Avoid ETOH while taking phentermine

## 2018-10-29 ENCOUNTER — Encounter: Payer: BLUE CROSS/BLUE SHIELD | Admitting: Nurse Practitioner

## 2018-11-01 ENCOUNTER — Other Ambulatory Visit: Payer: Self-pay | Admitting: Nurse Practitioner

## 2018-11-01 DIAGNOSIS — J309 Allergic rhinitis, unspecified: Secondary | ICD-10-CM

## 2018-11-02 ENCOUNTER — Encounter: Payer: Self-pay | Admitting: Podiatry

## 2018-11-27 ENCOUNTER — Encounter: Payer: BLUE CROSS/BLUE SHIELD | Admitting: Nurse Practitioner

## 2018-11-27 ENCOUNTER — Ambulatory Visit: Payer: BLUE CROSS/BLUE SHIELD | Admitting: Pulmonary Disease

## 2018-11-28 ENCOUNTER — Encounter: Payer: Self-pay | Admitting: Nurse Practitioner

## 2018-11-29 ENCOUNTER — Ambulatory Visit: Payer: BLUE CROSS/BLUE SHIELD | Admitting: Nurse Practitioner

## 2018-11-29 ENCOUNTER — Encounter: Payer: Self-pay | Admitting: Nurse Practitioner

## 2018-11-29 ENCOUNTER — Other Ambulatory Visit (HOSPITAL_COMMUNITY)
Admission: RE | Admit: 2018-11-29 | Discharge: 2018-11-29 | Disposition: A | Discharge status: Home / Self Care | Payer: BLUE CROSS/BLUE SHIELD | Source: Ambulatory Visit | Attending: Nurse Practitioner | Admitting: Nurse Practitioner

## 2018-11-29 ENCOUNTER — Ambulatory Visit: Payer: BLUE CROSS/BLUE SHIELD | Admitting: Adult Health

## 2018-11-29 ENCOUNTER — Ambulatory Visit (INDEPENDENT_AMBULATORY_CARE_PROVIDER_SITE_OTHER): Payer: BLUE CROSS/BLUE SHIELD | Admitting: Nurse Practitioner

## 2018-11-29 VITALS — BP 122/94 | HR 79 | Temp 98.8°F | Ht 62.0 in | Wt 220.0 lb

## 2018-11-29 DIAGNOSIS — Z13220 Encounter for screening for lipoid disorders: Secondary | ICD-10-CM | POA: Diagnosis not present

## 2018-11-29 DIAGNOSIS — Z136 Encounter for screening for cardiovascular disorders: Secondary | ICD-10-CM

## 2018-11-29 DIAGNOSIS — Z0001 Encounter for general adult medical examination with abnormal findings: Secondary | ICD-10-CM | POA: Diagnosis not present

## 2018-11-29 DIAGNOSIS — I10 Essential (primary) hypertension: Secondary | ICD-10-CM

## 2018-11-29 DIAGNOSIS — D500 Iron deficiency anemia secondary to blood loss (chronic): Secondary | ICD-10-CM

## 2018-11-29 DIAGNOSIS — J309 Allergic rhinitis, unspecified: Secondary | ICD-10-CM | POA: Diagnosis not present

## 2018-11-29 DIAGNOSIS — M778 Other enthesopathies, not elsewhere classified: Secondary | ICD-10-CM

## 2018-11-29 DIAGNOSIS — Z1231 Encounter for screening mammogram for malignant neoplasm of breast: Secondary | ICD-10-CM | POA: Diagnosis not present

## 2018-11-29 DIAGNOSIS — Z124 Encounter for screening for malignant neoplasm of cervix: Secondary | ICD-10-CM

## 2018-11-29 DIAGNOSIS — L659 Nonscarring hair loss, unspecified: Secondary | ICD-10-CM

## 2018-11-29 DIAGNOSIS — M79641 Pain in right hand: Secondary | ICD-10-CM

## 2018-11-29 DIAGNOSIS — B9689 Other specified bacterial agents as the cause of diseases classified elsewhere: Secondary | ICD-10-CM

## 2018-11-29 DIAGNOSIS — M79642 Pain in left hand: Secondary | ICD-10-CM

## 2018-11-29 DIAGNOSIS — N760 Acute vaginitis: Secondary | ICD-10-CM

## 2018-11-29 LAB — CBC
HEMATOCRIT: 37.9 % (ref 36.0–46.0)
HEMOGLOBIN: 12.3 g/dL (ref 12.0–15.0)
MCHC: 32.6 g/dL (ref 30.0–36.0)
MCV: 81.6 fl (ref 78.0–100.0)
PLATELETS: 277 10*3/uL (ref 150.0–400.0)
RBC: 4.64 Mil/uL (ref 3.87–5.11)
RDW: 17.3 % — ABNORMAL HIGH (ref 11.5–15.5)
WBC: 6.7 10*3/uL (ref 4.0–10.5)

## 2018-11-29 LAB — LIPID PANEL
Cholesterol: 125 mg/dL (ref 0–200)
HDL: 48.5 mg/dL (ref >39.00)
LDL Cholesterol: 66 mg/dL (ref 0–99)
NonHDL: 76.4
Total CHOL/HDL Ratio: 3
Triglycerides: 51 mg/dL (ref 0.0–149.0)
VLDL: 10.2 mg/dL (ref 0.0–40.0)

## 2018-11-29 LAB — COMPREHENSIVE METABOLIC PANEL
ALT: 11 U/L (ref 0–35)
AST: 10 U/L (ref 0–37)
Albumin: 4 g/dL (ref 3.5–5.2)
Alkaline Phosphatase: 61 U/L (ref 39–117)
BUN: 8 mg/dL (ref 6–23)
CO2: 23 mEq/L (ref 19–32)
Calcium: 8.4 mg/dL (ref 8.4–10.5)
Chloride: 109 mEq/L (ref 96–112)
Creatinine, Ser: 0.69 mg/dL (ref 0.40–1.20)
GFR: 113.96 mL/min (ref >60.00)
Glucose, Bld: 82 mg/dL (ref 70–99)
POTASSIUM: 3.8 meq/L (ref 3.5–5.1)
Sodium: 140 mEq/L (ref 135–145)
Total Bilirubin: 0.2 mg/dL (ref 0.2–1.2)
Total Protein: 6.8 g/dL (ref 6.0–8.3)

## 2018-11-29 LAB — IBC + FERRITIN
Ferritin: 5.7 ng/mL — ABNORMAL LOW (ref 10.0–291.0)
Iron: 24 ug/dL — ABNORMAL LOW (ref 42–145)
Saturation Ratios: 6.7 % — ABNORMAL LOW (ref 20.0–50.0)
Transferrin: 254 mg/dL (ref 212.0–360.0)

## 2018-11-29 LAB — TSH: TSH: 0.46 u[IU]/mL (ref 0.35–4.50)

## 2018-11-29 MED ORDER — GABAPENTIN 300 MG PO CAPS: 300.0000 mg | ORAL_CAPSULE | Freq: Three times a day (TID) | ORAL | 2 refills | Status: AC

## 2018-11-29 MED ORDER — FLUTICASONE PROPIONATE 50 MCG/ACT NA SUSP: 2.0000 | Freq: Every day | NASAL | 0 refills | Status: AC

## 2018-11-29 MED ORDER — AMLODIPINE BESYLATE 5 MG PO TABS: 5.0000 mg | ORAL_TABLET | Freq: Every day | ORAL | 3 refills | Status: AC

## 2018-11-29 MED ORDER — LISINOPRIL 20 MG PO TABS: 20.0000 mg | ORAL_TABLET | Freq: Every day | ORAL | 3 refills | Status: AC

## 2018-11-29 NOTE — Patient Instructions (Addendum)
CBC and iron panel indicate iron deficient anemia. Need to start ferrous sulfate '325mg'$  1tab BID with food. New rx sent. Normal TSH, CMP, and lipid panel. Pending vitamin D F/up in 57monthfor anemia and weight management  You will be contacted to schedule appt with dermatology.  Health Maintenance, Female Adopting a healthy lifestyle and getting preventive care can go a long way to promote health and wellness. Talk with your health care provider about what schedule of regular examinations is right for you. This is a good chance for you to check in with your provider about disease prevention and staying healthy. In between checkups, there are plenty of things you can do on your own. Experts have done a lot of research about which lifestyle changes and preventive measures are most likely to keep you healthy. Ask your health care provider for more information. Weight and diet Eat a healthy diet  Be sure to include plenty of vegetables, fruits, low-fat dairy products, and lean protein.  Do not eat a lot of foods high in solid fats, added sugars, or salt.  Get regular exercise. This is one of the most important things you can do for your health. ? Most adults should exercise for at least 150 minutes each week. The exercise should increase your heart rate and make you sweat (moderate-intensity exercise). ? Most adults should also do strengthening exercises at least twice a week. This is in addition to the moderate-intensity exercise. Maintain a healthy weight  Body mass index (BMI) is a measurement that can be used to identify possible weight problems. It estimates body fat based on height and weight. Your health care provider can help determine your BMI and help you achieve or maintain a healthy weight.  For females 233years of age and older: ? A BMI below 18.5 is considered underweight. ? A BMI of 18.5 to 24.9 is normal. ? A BMI of 25 to 29.9 is considered overweight. ? A BMI of 30 and above  is considered obese. Watch levels of cholesterol and blood lipids  You should start having your blood tested for lipids and cholesterol at 40years of age, then have this test every 5 years.  You may need to have your cholesterol levels checked more often if: ? Your lipid or cholesterol levels are high. ? You are older than 40years of age. ? You are at high risk for heart disease. Cancer screening Lung Cancer  Lung cancer screening is recommended for adults 571822years old who are at high risk for lung cancer because of a history of smoking.  A yearly low-dose CT scan of the lungs is recommended for people who: ? Currently smoke. ? Have quit within the past 15 years. ? Have at least a 30-pack-year history of smoking. A pack year is smoking an average of one pack of cigarettes a day for 1 year.  Yearly screening should continue until it has been 15 years since you quit.  Yearly screening should stop if you develop a health problem that would prevent you from having lung cancer treatment. Breast Cancer  Practice breast self-awareness. This means understanding how your breasts normally appear and feel.  It also means doing regular breast self-exams. Let your health care provider know about any changes, no matter how small.  If you are in your 20s or 30s, you should have a clinical breast exam (CBE) by a health care provider every 1-3 years as part of a regular health exam.  If  you are 40 or older, have a CBE every year. Also consider having a breast X-ray (mammogram) every year.  If you have a family history of breast cancer, talk to your health care provider about genetic screening.  If you are at high risk for breast cancer, talk to your health care provider about having an MRI and a mammogram every year.  Breast cancer gene (BRCA) assessment is recommended for women who have family members with BRCA-related cancers. BRCA-related cancers  include: ? Breast. ? Ovarian. ? Tubal. ? Peritoneal cancers.  Results of the assessment will determine the need for genetic counseling and BRCA1 and BRCA2 testing. Cervical Cancer Your health care provider may recommend that you be screened regularly for cancer of the pelvic organs (ovaries, uterus, and vagina). This screening involves a pelvic examination, including checking for microscopic changes to the surface of your cervix (Pap test). You may be encouraged to have this screening done every 3 years, beginning at age 82.  For women ages 16-65, health care providers may recommend pelvic exams and Pap testing every 3 years, or they may recommend the Pap and pelvic exam, combined with testing for human papilloma virus (HPV), every 5 years. Some types of HPV increase your risk of cervical cancer. Testing for HPV may also be done on women of any age with unclear Pap test results.  Other health care providers may not recommend any screening for nonpregnant women who are considered low risk for pelvic cancer and who do not have symptoms. Ask your health care provider if a screening pelvic exam is right for you.  If you have had past treatment for cervical cancer or a condition that could lead to cancer, you need Pap tests and screening for cancer for at least 20 years after your treatment. If Pap tests have been discontinued, your risk factors (such as having a new sexual partner) need to be reassessed to determine if screening should resume. Some women have medical problems that increase the chance of getting cervical cancer. In these cases, your health care provider may recommend more frequent screening and Pap tests. Colorectal Cancer  This type of cancer can be detected and often prevented.  Routine colorectal cancer screening usually begins at 40 years of age and continues through 40 years of age.  Your health care provider may recommend screening at an earlier age if you have risk factors for  colon cancer.  Your health care provider may also recommend using home test kits to check for hidden blood in the stool.  A small camera at the end of a tube can be used to examine your colon directly (sigmoidoscopy or colonoscopy). This is done to check for the earliest forms of colorectal cancer.  Routine screening usually begins at age 56.  Direct examination of the colon should be repeated every 5-10 years through 40 years of age. However, you may need to be screened more often if early forms of precancerous polyps or small growths are found. Skin Cancer  Check your skin from head to toe regularly.  Tell your health care provider about any new moles or changes in moles, especially if there is a change in a mole's shape or color.  Also tell your health care provider if you have a mole that is larger than the size of a pencil eraser.  Always use sunscreen. Apply sunscreen liberally and repeatedly throughout the day.  Protect yourself by wearing long sleeves, pants, a wide-brimmed hat, and sunglasses whenever you are  outside. Heart disease, diabetes, and high blood pressure  High blood pressure causes heart disease and increases the risk of stroke. High blood pressure is more likely to develop in: ? People who have blood pressure in the high end of the normal range (130-139/85-89 mm Hg). ? People who are overweight or obese. ? People who are African American.  If you are 32-24 years of age, have your blood pressure checked every 3-5 years. If you are 33 years of age or older, have your blood pressure checked every year. You should have your blood pressure measured twice-once when you are at a hospital or clinic, and once when you are not at a hospital or clinic. Record the average of the two measurements. To check your blood pressure when you are not at a hospital or clinic, you can use: ? An automated blood pressure machine at a pharmacy. ? A home blood pressure monitor.  If you are  between 42 years and 26 years old, ask your health care provider if you should take aspirin to prevent strokes.  Have regular diabetes screenings. This involves taking a blood sample to check your fasting blood sugar level. ? If you are at a normal weight and have a low risk for diabetes, have this test once every three years after 40 years of age. ? If you are overweight and have a high risk for diabetes, consider being tested at a younger age or more often. Preventing infection Hepatitis B  If you have a higher risk for hepatitis B, you should be screened for this virus. You are considered at high risk for hepatitis B if: ? You were born in a country where hepatitis B is common. Ask your health care provider which countries are considered high risk. ? Your parents were born in a high-risk country, and you have not been immunized against hepatitis B (hepatitis B vaccine). ? You have HIV or AIDS. ? You use needles to inject street drugs. ? You live with someone who has hepatitis B. ? You have had sex with someone who has hepatitis B. ? You get hemodialysis treatment. ? You take certain medicines for conditions, including cancer, organ transplantation, and autoimmune conditions. Hepatitis C  Blood testing is recommended for: ? Everyone born from [birth year] through 1967. ? Anyone with known risk factors for hepatitis C. Sexually transmitted infections (STIs)  You should be screened for sexually transmitted infections (STIs) including gonorrhea and chlamydia if: ? You are sexually active and are younger than 40 years of age. ? You are older than 40 years of age and your health care provider tells you that you are at risk for this type of infection. ? Your sexual activity has changed since you were last screened and you are at an increased risk for chlamydia or gonorrhea. Ask your health care provider if you are at risk.  If you do not have HIV, but are at risk, it may be recommended that you take  a prescription medicine daily to prevent HIV infection. This is called pre-exposure prophylaxis (PrEP). You are considered at risk if: ? You are sexually active and do not regularly use condoms or know the HIV status of your partner(s). ? You take drugs by injection. ? You are sexually active with a partner who has HIV. Talk with your health care provider about whether you are at high risk of being infected with HIV. If you choose to begin PrEP, you should first be tested for HIV. You should  then be tested every 3 months for as long as you are taking PrEP. Pregnancy  If you are premenopausal and you may become pregnant, ask your health care provider about preconception counseling.  If you may become pregnant, take 400 to 800 micrograms (mcg) of folic acid every day.  If you want to prevent pregnancy, talk to your health care provider about birth control (contraception). Osteoporosis and menopause  Osteoporosis is a disease in which the bones lose minerals and strength with aging. This can result in serious bone fractures. Your risk for osteoporosis can be identified using a bone density scan.  If you are 20 years of age or older, or if you are at risk for osteoporosis and fractures, ask your health care provider if you should be screened.  Ask your health care provider whether you should take a calcium or vitamin D supplement to lower your risk for osteoporosis.  Menopause may have certain physical symptoms and risks.  Hormone replacement therapy may reduce some of these symptoms and risks. Talk to your health care provider about whether hormone replacement therapy is right for you. Follow these instructions at home:  Schedule regular health, dental, and eye exams.  Stay current with your immunizations.  Do not use any tobacco products including cigarettes, chewing tobacco, or electronic cigarettes.  If you are pregnant, do not drink alcohol.  If you are breastfeeding, limit how  much and how often you drink alcohol.  Limit alcohol intake to no more than 1 drink per day for nonpregnant women. One drink equals 12 ounces of beer, 5 ounces of wine, or 1 ounces of hard liquor.  Do not use street drugs.  Do not share needles.  Ask your health care provider for help if you need support or information about quitting drugs.  Tell your health care provider if you often feel depressed.  Tell your health care provider if you have ever been abused or do not feel safe at home. This information is not intended to replace advice given to you by your health care provider. Make sure you discuss any questions you have with your health care provider. Document Released: 04/10/2011 Document Revised: 03/02/2016 Document Reviewed: 06/29/2015 Elsevier Interactive Patient Education  2019 [employer].

## 2018-11-29 NOTE — Progress Notes (Deleted)
@Patient  ID: Emily Salazar, female    DOB: Oct 12, 1980, 38 y.o.   MRN: [id number]  No chief complaint on file.   Referring provider: Anne Ng, NP   HPI 38 year old female current smoker with asthma followed by Dr. Tonia Brooms.  Tests:   PFT: PFT Results Latest Ref Rng & Units 09/26/2018  FVC-Pre L 1.55  FVC-Predicted Pre % 54  FVC-Post L 1.97  FVC-Predicted Post % 68  Pre FEV1/FVC % % 98  Post FEV1/FCV % % 99  FEV1-Pre L 1.52  FEV1-Predicted Pre % 63  FEV1-Post L 1.95  DLCO UNC% % 60  DLCO COR %Predicted % 104  TLC L 3.49  TLC % Predicted % 73  RV % Predicted % 112         Allergies  Allergen Reactions  . Penicillins Swelling    Blister on lips,neck and shoulder. Ask first  . Sulfa Antibiotics Swelling     There is no immunization history on file for this patient.  Past Medical History:  Diagnosis Date  . ADHD    not taking med right now  . Asthma   . BMI 40.0-44.9, adult (HCC) 07/05/2018  . Depression    no med taking  . Hypertension   . Morbid obesity due to excess calories (HCC) 07/05/2018    Tobacco History: Social History   Tobacco Use  Smoking Status Current Every Day Smoker  . Packs/day: 0.25  Smokeless Tobacco Never Used  Tobacco Comment   5 cigarettes per day    Ready to quit: Not Answered Counseling given: Not Answered Comment: 5 cigarettes per day    Outpatient Encounter Medications as of 11/29/2018  Medication Sig  . Acetaminophen (TYLENOL 8 HOUR PO) Take by mouth.  Marland Kitchen albuterol (PROAIR HFA) 108 (90 Base) MCG/ACT inhaler Inhale 1-2 puffs into the lungs every 6 (six) hours as needed for wheezing or shortness of breath.  Marland Kitchen albuterol (PROVENTIL) (2.5 MG/3ML) 0.083% nebulizer solution Take 3 mLs (2.5 mg total) by nebulization every 4 (four) hours as needed for wheezing or shortness of breath.  Marland Kitchen amLODipine (NORVASC) 5 MG tablet Take 1 tablet (5 mg total) by mouth at bedtime.  . cetirizine (ZYRTEC) 10 MG tablet TAKE 1 TABLET BY  MOUTH AT BEDTIME  . Cholecalciferol (VITAMIN D) 50 MCG (2000 UT) CAPS Take 1 capsule (2,000 Units total) by mouth daily.  . fluticasone (FLONASE) 50 MCG/ACT nasal spray Place 2 sprays into both nostrils daily.  Marland Kitchen gabapentin (NEURONTIN) 300 MG capsule Take 1 capsule (300 mg total) by mouth 3 (three) times daily.  Marland Kitchen lisinopril (PRINIVIL,ZESTRIL) 20 MG tablet Take 1 tablet (20 mg total) by mouth daily.  . montelukast (SINGULAIR) 10 MG tablet TAKE 1 TABLET BY MOUTH AT BEDTIME  . NON FORMULARY OTC for allergy  . omeprazole (PRILOSEC) 20 MG capsule Take 1 capsule (20 mg total) by mouth daily.  . phentermine 15 MG capsule Take 1 capsule (15 mg total) by mouth every morning.  Marland Kitchen Spacer/Aero-Holding Chambers (AEROCHAMBER MV) inhaler Use as instructed  . triamcinolone cream (KENALOG) 0.1 % Apply 1 application topically 2 (two) times daily.   Facility-Administered Encounter Medications as of 11/29/2018  Medication  . ipratropium-albuterol (DUONEB) 0.5-2.5 (3) MG/3ML nebulizer solution 3 mL     Review of Systems  Review of Systems     Physical Exam  There were no vitals taken for this visit.  Wt Readings from Last 5 Encounters:  10/18/18 224 lb 12.8 oz (102 kg)  09/26/18 231  lb 12.8 oz (105.1 kg)  09/26/18 232 lb (105.2 kg)  09/02/18 233 lb 12.8 oz (106.1 kg)  08/20/18 233 lb (105.7 kg)     Physical Exam   Lab Results:  CBC    Component Value Date/Time   WBC 7.0 07/05/2018 1459   RBC 4.14 07/05/2018 1459   HGB 11.0 (L) 07/05/2018 1459   HCT 33.6 (L) 07/05/2018 1459   PLT 334.0 07/05/2018 1459   MCV 81.2 07/05/2018 1459   MCH 24.0 (L) 01/04/2018 1412   MCHC 32.9 07/05/2018 1459   RDW 15.5 07/05/2018 1459   LYMPHSABS 2.5 07/05/2018 1459   MONOABS 0.4 07/05/2018 1459   EOSABS 0.2 07/05/2018 1459   BASOSABS 0.0 07/05/2018 1459    BMET    Component Value Date/Time   NA 139 05/20/2018 1630   K 3.7 05/20/2018 1630   CL 108 05/20/2018 1630   CO2 25 05/20/2018 1630    GLUCOSE 84 05/20/2018 1630   BUN 9 05/20/2018 1630   CREATININE 0.86 05/20/2018 1630   CALCIUM 9.2 05/20/2018 1630   GFRNONAA >60 01/04/2018 1412   GFRAA >60 01/04/2018 1412    BNP No results found for: BNP  ProBNP No results found for: PROBNP  Imaging: No results found.   Assessment & Plan:   No problem-specific Assessment & Plan notes found for this encounter.     Ivonne Andrew, NP 11/29/2018

## 2018-11-29 NOTE — Progress Notes (Addendum)
Subjective:    Patient ID: Emily Salazar, female    DOB: January 18, 1981, 38 y.o.   MRN: [id number]  Patient presents today for complete physical and eval of chronic conditions  HPI  HTN: BP at goal with amlodipine and lisinopril BP Readings from Last 3 Encounters:  12/02/18 110/66  11/29/18 (!) 122/94  10/18/18 124/90   Asthma: Controlled with albuterol prn. Followed by Dr. Tonia Brooms (pulmonology)  She also complains of chronic hair loss in patches, onset last year. Associated with intermittent flaky scapy. Denies use of strengthening chemicals, use of hot comb every 2weeks.   Sexual History (orientation,birth control, marital status, STD):sexually active with female and female  Depression/Suicide: Depression screen [facility] 2/9 10/18/2018 03/01/2018  Decreased Interest 0 0  Down, Depressed, Hopeless 0 0  PHQ - 2 Score 0 0   Vision:up to date  Dental:up to date  Immunizations: (TDAP, Hep C screen, Pneumovax, Influenza, zoster)  Health Maintenance  Topic Date Due  . HIV Screening  10/24/1993  . Flu Shot  02/12/2019*  . Pap Smear  07/28/2020  . Tetanus Vaccine  04/07/2026  *Topic was postponed. The date shown is not the original due date.   Diet:small portions, low fat/low sodium/low  Weight:  Wt Readings from Last 3 Encounters:  12/02/18 228 lb 3.2 oz (103.5 kg)  11/29/18 220 lb (99.8 kg)  10/18/18 224 lb 12.8 oz (102 kg)   Exercise:walking daily  Fall Risk: Fall Risk  10/18/2018 03/01/2018  Falls in the past year? 0 No   Home Safety:home alone  Medications and allergies reviewed with patient and updated if appropriate.  Patient Active Problem List   Diagnosis Date Noted  . Iron deficiency anemia due to chronic blood loss 12/02/2018  . Sinusitis 12/02/2018  . Allergic rhinitis 11/29/2018  . Tendonitis of both wrists 11/29/2018  . Breast cancer screening by mammogram 11/29/2018  . Patchy loss of hair 11/29/2018  . S/P tubal ligation 08/20/2018  . S/P cholecystectomy  08/20/2018  . BMI 40.0-44.9, adult (HCC) 07/05/2018  . Morbid obesity due to excess calories (HCC) 07/05/2018  . Moderate persistent asthma without complication 07/05/2018  . Chronic cough 06/25/2018  . Hand pain 04/08/2018  . HTN (hypertension) 03/18/2018  . Eczema 03/18/2018    Current Outpatient Medications on File Prior to Visit  Medication Sig Dispense Refill  . Acetaminophen (TYLENOL 8 HOUR PO) Take by mouth.    Marland Kitchen albuterol (PROAIR HFA) 108 (90 Base) MCG/ACT inhaler Inhale 1-2 puffs into the lungs every 6 (six) hours as needed for wheezing or shortness of breath. 1 Inhaler 3  . albuterol (PROVENTIL) (2.5 MG/3ML) 0.083% nebulizer solution Take 3 mLs (2.5 mg total) by nebulization every 4 (four) hours as needed for wheezing or shortness of breath. 75 mL 3  . cetirizine (ZYRTEC) 10 MG tablet TAKE 1 TABLET BY MOUTH AT BEDTIME 30 tablet 2  . Cholecalciferol (VITAMIN D) 50 MCG (2000 UT) CAPS Take 1 capsule (2,000 Units total) by mouth daily. 30 capsule 5  . montelukast (SINGULAIR) 10 MG tablet TAKE 1 TABLET BY MOUTH AT BEDTIME 30 tablet 2  . NON FORMULARY OTC for allergy    . omeprazole (PRILOSEC) 20 MG capsule Take 1 capsule (20 mg total) by mouth daily. 30 capsule 11  . phentermine 15 MG capsule Take 1 capsule (15 mg total) by mouth every morning. 30 capsule 1  . Spacer/Aero-Holding Chambers (AEROCHAMBER MV) inhaler Use as instructed 1 each 0  . triamcinolone cream (KENALOG) 0.1 % Apply  1 application topically 2 (two) times daily. 30 g 0   Current Facility-Administered Medications on File Prior to Visit  Medication Dose Route Frequency Provider Last Rate Last Dose  . ipratropium-albuterol (DUONEB) 0.5-2.5 (3) MG/3ML nebulizer solution 3 mL  3 mL Nebulization Q6H Oreatha Fabry, Bonna Gains, NP   3 mL at 06/25/18 1359    Past Medical History:  Diagnosis Date  . ADHD    not taking med right now  . Asthma   . BMI 40.0-44.9, adult (HCC) 07/05/2018  . Depression    no med taking  .  Hypertension   . Morbid obesity due to excess calories (HCC) 07/05/2018    Past Surgical History:  Procedure Laterality Date  . CESAREAN SECTION  2004 and 2007  . CHOLECYSTECTOMY  2007  . KIDNEY STONE SURGERY  2013   2 times  . NOSE SURGERY  1991   broken  . TUBAL LIGATION  2007    Social History   Socioeconomic History  . Marital status: Single    Spouse name: Not on file  . Number of children: 2  . Years of education: Not on file  . Highest education level: Not on file  Occupational History  . Occupation: assembly line    Comment: Oncologist  Social Needs  . Financial resource strain: Not on file  . Food insecurity:    Worry: Not on file    Inability: Not on file  . Transportation needs:    Medical: Not on file    Non-medical: Not on file  Tobacco Use  . Smoking status: Current Some Day Smoker    Packs/day: 0.25  . Smokeless tobacco: Never Used  . Tobacco comment: 5 cigarettes per day   Substance and Sexual Activity  . Alcohol use: Not Currently    Frequency: Never    Comment: rare  . Drug use: Yes    Frequency: 2.0 times per week    Types: Marijuana  . Sexual activity: Yes    Birth control/protection: Condom  Lifestyle  . Physical activity:    Days per week: Not on file    Minutes per session: Not on file  . Stress: Not on file  Relationships  . Social connections:    Talks on phone: Not on file    Gets together: Not on file    Attends religious service: Not on file    Active member of club or organization: Not on file    Attends meetings of clubs or organizations: Not on file    Relationship status: Not on file  Other Topics Concern  . Not on file  Social History Narrative   Home with sister at this time.   Children in [place] (father has custody)    Incarcerated for 42yrs.   Released 07/2017 from [facility] prison.    Family History  Problem Relation Age of Onset  . Diabetes Mother   . Cancer Maternal Aunt        lung cancer  . Heart disease  Maternal Uncle   . Cancer Maternal Grandmother        brain cancer  . Crohn's disease Cousin   . Hypertension Father         ROS  Objective:   Vitals:   11/29/18 1311  BP: (!) 122/94  Pulse: 79  Temp: 98.8 F (37.1 C)  SpO2: 98%    Body mass index is 40.24 kg/m.   Physical Examination:  Physical Exam Vitals signs reviewed. Exam conducted  with a chaperone present.  Constitutional:      General: She is not in acute distress. HENT:     Right Ear: External ear normal.     Left Ear: External ear normal.     Nose: Nose normal.     Mouth/Throat:     Pharynx: No oropharyngeal exudate.  Eyes:     General: No scleral icterus.    Conjunctiva/sclera: Conjunctivae normal.     Pupils: Pupils are equal, round, and reactive to light.  Neck:     Musculoskeletal: Normal range of motion and neck supple.     Thyroid: No thyromegaly.  Cardiovascular:     Rate and Rhythm: Normal rate.     Heart sounds: Normal heart sounds.  Pulmonary:     Effort: Pulmonary effort is normal.     Breath sounds: Normal breath sounds.  Chest:     Chest wall: No tenderness.     Breasts:        Right: Normal.        Left: Normal.  Abdominal:     General: Bowel sounds are normal. There is no distension.     Palpations: Abdomen is soft.     Tenderness: There is no abdominal tenderness. There is no right CVA tenderness or left CVA tenderness.  Genitourinary:    Vagina: Vaginal discharge present. No erythema.     Cervix: Discharge present. No erythema.     Uterus: Normal.      Adnexa: Right adnexa normal and left adnexa normal.  Musculoskeletal: Normal range of motion.        General: No tenderness.  Lymphadenopathy:     Cervical: No cervical adenopathy.     Upper Body:     Right upper body: No supraclavicular, axillary or pectoral adenopathy.     Left upper body: No supraclavicular, axillary or pectoral adenopathy.     Lower Body: No right inguinal adenopathy. No left inguinal adenopathy.    Skin:    General: Skin is warm and dry.  Neurological:     Mental Status: She is alert and oriented to person, place, and time.  Psychiatric:        Mood and Affect: Mood normal.        Behavior: Behavior normal.        Judgment: Judgment normal.     ASSESSMENT and PLAN:  Hema was seen today for annual exam.  Diagnoses and all orders for this visit:  Encounter for preventative adult health care exam with abnormal findings -     CBC -     Comprehensive metabolic panel -     TSH -     Lipid panel  Essential hypertension -     amLODipine (NORVASC) 5 MG tablet; Take 1 tablet (5 mg total) by mouth at bedtime. -     lisinopril (PRINIVIL,ZESTRIL) 20 MG tablet; Take 1 tablet (20 mg total) by mouth daily.  Encounter for lipid screening for cardiovascular disease -     Lipid panel  Encounter for Papanicolaou smear for cervical cancer screening -     Cancel: Cytology - PAP( [place]) -     Cytology - PAP( [place])  Patchy loss of hair -     IBC + Ferritin -     Vitamin D 1,25 dihydroxy -     Ambulatory referral to Dermatology  Allergic rhinitis, unspecified seasonality, unspecified trigger -     fluticasone (FLONASE) 50 MCG/ACT nasal spray; Place 2 sprays into  both nostrils daily.  Tendonitis of both wrists -     gabapentin (NEURONTIN) 300 MG capsule; Take 1 capsule (300 mg total) by mouth 3 (three) times daily.  Pain in both hands -     gabapentin (NEURONTIN) 300 MG capsule; Take 1 capsule (300 mg total) by mouth 3 (three) times daily.  Breast cancer screening by mammogram -     MM 3D SCREEN BREAST BILATERAL; Future  Iron deficiency anemia due to chronic blood loss -     ferrous sulfate 325 (65 FE) MG tablet; Take 1 tablet (325 mg total) by mouth 2 (two) times daily with a meal.  BV (bacterial vaginosis) -     metroNIDAZOLE (METROGEL VAGINAL) 0.75 % vaginal gel; Place 1 Applicatorful vaginally at bedtime.    No problem-specific Assessment & Plan notes found  for this encounter.      Problem List Items Addressed This Visit      Cardiovascular and Mediastinum   HTN (hypertension)   Relevant Medications   amLODipine (NORVASC) 5 MG tablet   lisinopril (PRINIVIL,ZESTRIL) 20 MG tablet     Respiratory   Allergic rhinitis   Relevant Medications   fluticasone (FLONASE) 50 MCG/ACT nasal spray     Musculoskeletal and Integument   Tendonitis of both wrists   Relevant Medications   gabapentin (NEURONTIN) 300 MG capsule     Other   Breast cancer screening by mammogram   Relevant Orders   MM 3D SCREEN BREAST BILATERAL   Hand pain   Relevant Medications   gabapentin (NEURONTIN) 300 MG capsule   Iron deficiency anemia due to chronic blood loss   Relevant Medications   ferrous sulfate 325 (65 FE) MG tablet   Patchy loss of hair   Relevant Orders   IBC + Ferritin (Completed)   Vitamin D 1,25 dihydroxy   Ambulatory referral to Dermatology    Other Visit Diagnoses    Encounter for preventative adult health care exam with abnormal findings    -  Primary   Relevant Orders   CBC (Completed)   Comprehensive metabolic panel (Completed)   TSH (Completed)   Lipid panel (Completed)   Encounter for lipid screening for cardiovascular disease       Relevant Orders   Lipid panel (Completed)   Encounter for Papanicolaou smear for cervical cancer screening       Relevant Orders   Cytology - PAP( [place]) (Completed)   BV (bacterial vaginosis)       Relevant Medications   metroNIDAZOLE (METROGEL VAGINAL) 0.75 % vaginal gel       Follow up: Return in about 4 weeks (around 12/27/2018) for anemia and weight management.  Alysia Penna, NP

## 2018-12-02 ENCOUNTER — Encounter: Payer: Self-pay | Admitting: Nurse Practitioner

## 2018-12-02 ENCOUNTER — Ambulatory Visit (INDEPENDENT_AMBULATORY_CARE_PROVIDER_SITE_OTHER): Payer: BLUE CROSS/BLUE SHIELD | Admitting: Nurse Practitioner

## 2018-12-02 VITALS — BP 110/66 | HR 89 | Ht 62.0 in | Wt 228.2 lb

## 2018-12-02 DIAGNOSIS — J329 Chronic sinusitis, unspecified: Secondary | ICD-10-CM | POA: Diagnosis not present

## 2018-12-02 DIAGNOSIS — D500 Iron deficiency anemia secondary to blood loss (chronic): Secondary | ICD-10-CM | POA: Insufficient documentation

## 2018-12-02 DIAGNOSIS — J4540 Moderate persistent asthma, uncomplicated: Secondary | ICD-10-CM

## 2018-12-02 MED ORDER — BUDESONIDE-FORMOTEROL FUMARATE 160-4.5 MCG/ACT IN AERO: 2.0000 | INHALATION_SPRAY | Freq: Two times a day (BID) | RESPIRATORY_TRACT | 0 refills | Status: DC

## 2018-12-02 MED ORDER — FERROUS SULFATE 325 (65 FE) MG PO TABS: 325.0000 mg | ORAL_TABLET | Freq: Two times a day (BID) | ORAL | 1 refills | Status: AC

## 2018-12-02 MED ORDER — DOXYCYCLINE HYCLATE 100 MG PO TABS: 100.0000 mg | ORAL_TABLET | Freq: Two times a day (BID) | ORAL | 0 refills | Status: DC

## 2018-12-02 MED ORDER — METHYLPREDNISOLONE ACETATE 80 MG/ML IJ SUSP
80.0000 mg | Freq: Once | INTRAMUSCULAR | Status: AC
  Administered 2018-12-02: 80 mg via INTRAMUSCULAR

## 2018-12-02 MED ORDER — BENZONATATE 100 MG PO CAPS: 100.0000 mg | ORAL_CAPSULE | Freq: Three times a day (TID) | ORAL | 1 refills | Status: DC

## 2018-12-02 NOTE — Assessment & Plan Note (Signed)
Patient complains today of sinus congestion with cough.  Will treat today sinusitis.  Postnasal drip is contributing to cough.  Will give Dr. Tonia Brooms FMLA papers to fill out if indicated.  Patient Instructions  Asthma: Continue symbicort - samples given today- please call for refill when sample runs out Continue albuterol as needed Continue singulair Continue flonase Quit smoking  Sinusitis: Will order doxycyline DepoMedrol given in office today General instructions: Hydrate  Drink enough water to keep your pee (urine) clear or pale yellow. Rest  Rest as much as possible.  Sleep with your head raised (elevated).  Make sure to get enough sleep each night. Other instructions  Put a warm, moist washcloth on your face 3-4 times a day or as told by your doctor. This will help with discomfort.  Wash your hands often with soap and water. If there is no soap and water, use hand sanitizer.  Do not smoke. Avoid being around people who are smoking (secondhand smoke). Call the office if:  You have a fever.  Your symptoms get worse.  Your symptoms do not get better within 10 days.  Follow up With Dr. Tonia Brooms at his first available in around 1 month or sooner if needed

## 2018-12-02 NOTE — Patient Instructions (Addendum)
Asthma: Continue symbicort - samples given today- please call for refill when sample runs out Continue albuterol as needed Continue singulair Continue flonase Quit smoking  Sinusitis: Will order doxycyline DepoMedrol given in office today General instructions: Hydrate  Drink enough water to keep your pee (urine) clear or pale yellow. Rest  Rest as much as possible.  Sleep with your head raised (elevated).  Make sure to get enough sleep each night. Other instructions  Put a warm, moist washcloth on your face 3-4 times a day or as told by your doctor. This will help with discomfort.  Wash your hands often with soap and water. If there is no soap and water, use hand sanitizer.  Do not smoke. Avoid being around people who are smoking (secondhand smoke). Call the office if:  You have a fever.  Your symptoms get worse.  Your symptoms do not get better within 10 days.  Follow up With Dr. Tonia Brooms at his first available in around 1 month or sooner if needed

## 2018-12-02 NOTE — Progress Notes (Signed)
@Patient  ID: Emily Salazar, female    DOB: Jan 26, 1981, 38 y.o.   MRN: [id number]  Chief Complaint  Patient presents with  . Acute Visit    cough  and congestion - needs FMLA paperwork    Referring provider: Anne Ng, NP  HPI 38 year old female active smoker with asthma and allergic rhinitis who is followed by Dr. Tonia Brooms  Tests: CXR 06/25/18 - No active cardiopulmonary disease.  PFT: PFT Results Latest Ref Rng & Units 09/26/2018  FVC-Pre L 1.55  FVC-Predicted Pre % 54  FVC-Post L 1.97  FVC-Predicted Post % 68  Pre FEV1/FVC % % 98  Post FEV1/FCV % % 99  FEV1-Pre L 1.52  FEV1-Predicted Pre % 63  FEV1-Post L 1.95  DLCO UNC% % 60  DLCO COR %Predicted % 104  TLC L 3.49  TLC % Predicted % 73  RV % Predicted % 112   OV 12/02/18 - Acute cough, sinus and chest congestion Presents today for cough, sinus, and chest congestion.  She states his symptoms started around 1 week ago.  She states that her cough is productive of clear sputum.  She also complains today of a sore throat.  Complains of bilateral ear pain and sinus pressure and pain.  She states that symptoms are progressively worsening.  States that she ran out of her Symbicort samples and is requesting a new sample today.  She states that it was helping until she ran out 1 week ago.  Patient is still smoking but she is actively trying to cut back and trying to quit.  Denies any recent fever.  She denies any shortness of breath, chest pain, or edema.  She has FMLA paperwork for Dr. Tonia Brooms to fill out today.    Allergies  Allergen Reactions  . Penicillins Swelling    Blister on lips,neck and shoulder. Ask first  . Sulfa Antibiotics Swelling     There is no immunization history on file for this patient.  Past Medical History:  Diagnosis Date  . ADHD    not taking med right now  . Asthma   . BMI 40.0-44.9, adult (HCC) 07/05/2018  . Depression    no med taking  . Hypertension   . Morbid obesity due to excess  calories (HCC) 07/05/2018    Tobacco History: Social History   Tobacco Use  Smoking Status Current Some Day Smoker  . Packs/day: 0.25  Smokeless Tobacco Never Used  Tobacco Comment   5 cigarettes per day    Ready to quit: Not Answered Counseling given: Not Answered Comment: 5 cigarettes per day    Outpatient Encounter Medications as of 12/02/2018  Medication Sig  . Acetaminophen (TYLENOL 8 HOUR PO) Take by mouth.  Marland Kitchen albuterol (PROAIR HFA) 108 (90 Base) MCG/ACT inhaler Inhale 1-2 puffs into the lungs every 6 (six) hours as needed for wheezing or shortness of breath.  Marland Kitchen albuterol (PROVENTIL) (2.5 MG/3ML) 0.083% nebulizer solution Take 3 mLs (2.5 mg total) by nebulization every 4 (four) hours as needed for wheezing or shortness of breath.  Marland Kitchen amLODipine (NORVASC) 5 MG tablet Take 1 tablet (5 mg total) by mouth at bedtime.  . cetirizine (ZYRTEC) 10 MG tablet TAKE 1 TABLET BY MOUTH AT BEDTIME  . Cholecalciferol (VITAMIN D) 50 MCG (2000 UT) CAPS Take 1 capsule (2,000 Units total) by mouth daily.  . ferrous sulfate 325 (65 FE) MG tablet Take 1 tablet (325 mg total) by mouth 2 (two) times daily with a meal.  . fluticasone (  FLONASE) 50 MCG/ACT nasal spray Place 2 sprays into both nostrils daily.  Marland Kitchen. gabapentin (NEURONTIN) 300 MG capsule Take 1 capsule (300 mg total) by mouth 3 (three) times daily.  Marland Kitchen. lisinopril (PRINIVIL,ZESTRIL) 20 MG tablet Take 1 tablet (20 mg total) by mouth daily.  . montelukast (SINGULAIR) 10 MG tablet TAKE 1 TABLET BY MOUTH AT BEDTIME  . NON FORMULARY OTC for allergy  . omeprazole (PRILOSEC) 20 MG capsule Take 1 capsule (20 mg total) by mouth daily.  . phentermine 15 MG capsule Take 1 capsule (15 mg total) by mouth every morning.  Marland Kitchen. Spacer/Aero-Holding Chambers (AEROCHAMBER MV) inhaler Use as instructed  . triamcinolone cream (KENALOG) 0.1 % Apply 1 application topically 2 (two) times daily.  . benzonatate (TESSALON) 100 MG capsule Take 1 capsule (100 mg total) by mouth  3 (three) times daily.  . budesonide-formoterol (SYMBICORT) 160-4.5 MCG/ACT inhaler Inhale 2 puffs into the lungs 2 (two) times daily for 1 day.  Marland Kitchen. doxycycline (VIBRA-TABS) 100 MG tablet Take 1 tablet (100 mg total) by mouth 2 (two) times daily.   Facility-Administered Encounter Medications as of 12/02/2018  Medication  . ipratropium-albuterol (DUONEB) 0.5-2.5 (3) MG/3ML nebulizer solution 3 mL  . [COMPLETED] methylPREDNISolone acetate (DEPO-MEDROL) injection 80 mg     Review of Systems  Review of Systems  Constitutional: Negative.  Negative for chills and fever.  HENT: Positive for congestion, ear pain, postnasal drip, sinus pressure and sinus pain.   Respiratory: Positive for cough. Negative for shortness of breath and wheezing.   Cardiovascular: Negative.  Negative for chest pain, palpitations and leg swelling.  Gastrointestinal: Negative.   Allergic/Immunologic: Negative.   Neurological: Negative.   Psychiatric/Behavioral: Negative.        Physical Exam  BP 110/66 (BP Location: Left Arm, Patient Position: Sitting, Cuff Size: Normal)   Pulse 89   Ht 5\' 2"  (1.575 m)   Wt 228 lb 3.2 oz (103.5 kg)   LMP 11/15/2018 (Exact Date)   SpO2 100%   BMI 41.74 kg/m   Wt Readings from Last 5 Encounters:  12/02/18 228 lb 3.2 oz (103.5 kg)  11/29/18 220 lb (99.8 kg)  10/18/18 224 lb 12.8 oz (102 kg)  09/26/18 231 lb 12.8 oz (105.1 kg)  09/26/18 232 lb (105.2 kg)     Physical Exam Vitals signs and nursing note reviewed.  Constitutional:      General: She is not in acute distress.    Appearance: She is well-developed.  Cardiovascular:     Rate and Rhythm: Normal rate and regular rhythm.  Pulmonary:     Effort: Pulmonary effort is normal. No respiratory distress.     Breath sounds: Normal breath sounds. No wheezing or rhonchi.  Musculoskeletal:        General: No swelling.  Neurological:     Mental Status: She is alert and oriented to person, place, and time.        Assessment & Plan:   Sinusitis Patient complains today of sinus congestion with cough.  Will treat today sinusitis.  Postnasal drip is contributing to cough.  Will give Dr. Tonia BroomsIcard FMLA papers to fill out if indicated.  Patient Instructions  Asthma: Continue symbicort - samples given today- please call for refill when sample runs out Continue albuterol as needed Continue singulair Continue flonase Quit smoking  Sinusitis: Will order doxycyline DepoMedrol given in office today General instructions: Hydrate  Drink enough water to keep your pee (urine) clear or pale yellow. Rest  Rest as much as  possible.  Sleep with your head raised (elevated).  Make sure to get enough sleep each night. Other instructions  Put a warm, moist washcloth on your face 3-4 times a day or as told by your doctor. This will help with discomfort.  Wash your hands often with soap and water. If there is no soap and water, use hand sanitizer.  Do not smoke. Avoid being around people who are smoking (secondhand smoke). Call the office if:  You have a fever.  Your symptoms get worse.  Your symptoms do not get better within 10 days.  Follow up With Dr. Tonia Brooms at his first available in around 1 month or sooner if needed          Ivonne Andrew, NP 12/02/2018

## 2018-12-02 NOTE — Progress Notes (Signed)
FMLA paperwork left in Dr. Marjory Lies box.  Angus Seller NP saw patient today for acute visit but patient requested FMLA paperwork be completed by Dr. Tonia Brooms due to her stating she has 'multiple appointments here due to allergies and asthma symptoms' and her employer wanted her to get FMLA completed. Patient requests once our section is completed we can fax to [employer] at [phone].  Dr. Tonia Brooms was not available today in the office to consult.

## 2018-12-03 LAB — CYTOLOGY - PAP
Bacterial vaginitis: POSITIVE — AB
Candida vaginitis: NEGATIVE
Chlamydia: NEGATIVE
DIAGNOSIS: NEGATIVE
HPV: NOT DETECTED
Neisseria Gonorrhea: NEGATIVE
Trichomonas: NEGATIVE

## 2018-12-03 MED ORDER — METRONIDAZOLE 0.75 % VA GEL: 1.0000 | Freq: Every day | VAGINAL | 0 refills | Status: DC

## 2018-12-03 NOTE — Addendum Note (Signed)
Addended by: Michaela Corner on: 12/03/2018 05:07 PM   Modules accepted: Orders

## 2018-12-04 LAB — VITAMIN D 1,25 DIHYDROXY
VITAMIN D 1, 25 (OH) TOTAL: 45 pg/mL (ref 18–72)
Vitamin D2 1, 25 (OH)2: <8 pg/mL
Vitamin D3 1, 25 (OH)2: 45 pg/mL

## 2018-12-04 NOTE — Progress Notes (Signed)
PCCM: The patients FMLA paperwork should be given to her PCP.  Thanks for seeing her.  Josephine Igo, DO  Pulmonary Critical Care 12/04/2018 5:12 PM

## 2018-12-06 NOTE — Progress Notes (Signed)
PCCM: I am not sure what portion we need to fill out. She has chronic cough and asthma. She is still smoking per documentation. I am not sure the need for FMLA.  Josephine Igo, DO [place] [facility] 12/06/2018 2:11 PM

## 2018-12-12 ENCOUNTER — Telehealth: Payer: Self-pay | Admitting: Pulmonary Disease

## 2018-12-12 NOTE — Telephone Encounter (Signed)
Dr. Tonia Brooms completed FMLA paperwork - faxed to Employer, informed patient, and scanned into the chart -pr

## 2018-12-17 ENCOUNTER — Encounter: Payer: Self-pay | Admitting: Nurse Practitioner

## 2018-12-30 ENCOUNTER — Ambulatory Visit: Payer: BLUE CROSS/BLUE SHIELD

## 2019-01-08 ENCOUNTER — Ambulatory Visit: Payer: BLUE CROSS/BLUE SHIELD | Admitting: Nurse Practitioner

## 2019-01-08 ENCOUNTER — Ambulatory Visit: Payer: BLUE CROSS/BLUE SHIELD | Admitting: Pulmonary Disease

## 2019-01-09 ENCOUNTER — Ambulatory Visit: Payer: BLUE CROSS/BLUE SHIELD | Admitting: Nurse Practitioner

## 2019-01-10 ENCOUNTER — Other Ambulatory Visit: Payer: Self-pay

## 2019-01-10 ENCOUNTER — Ambulatory Visit: Payer: Self-pay | Admitting: Nurse Practitioner

## 2019-02-18 ENCOUNTER — Other Ambulatory Visit: Payer: Self-pay | Admitting: Nurse Practitioner

## 2019-02-18 DIAGNOSIS — J309 Allergic rhinitis, unspecified: Secondary | ICD-10-CM

## 2019-02-24 ENCOUNTER — Encounter: Payer: Self-pay | Admitting: Nurse Practitioner

## 2019-02-24 ENCOUNTER — Ambulatory Visit (INDEPENDENT_AMBULATORY_CARE_PROVIDER_SITE_OTHER): Payer: Self-pay | Admitting: Nurse Practitioner

## 2019-02-24 VITALS — Ht 62.0 in | Wt 237.3 lb

## 2019-02-24 DIAGNOSIS — J309 Allergic rhinitis, unspecified: Secondary | ICD-10-CM

## 2019-02-24 DIAGNOSIS — Z7689 Persons encountering health services in other specified circumstances: Secondary | ICD-10-CM

## 2019-02-24 DIAGNOSIS — I10 Essential (primary) hypertension: Secondary | ICD-10-CM

## 2019-02-24 DIAGNOSIS — J4540 Moderate persistent asthma, uncomplicated: Secondary | ICD-10-CM

## 2019-02-24 MED ORDER — BUDESONIDE-FORMOTEROL FUMARATE 160-4.5 MCG/ACT IN AERO: 2.0000 | INHALATION_SPRAY | Freq: Two times a day (BID) | RESPIRATORY_TRACT | 0 refills | Status: AC

## 2019-02-24 MED ORDER — CETIRIZINE HCL 10 MG PO TABS: 10.0000 mg | ORAL_TABLET | Freq: Every day | ORAL | 2 refills | Status: AC

## 2019-02-24 MED ORDER — BENZONATATE 100 MG PO CAPS: 100.0000 mg | ORAL_CAPSULE | Freq: Three times a day (TID) | ORAL | 0 refills | Status: AC

## 2019-02-24 NOTE — Patient Instructions (Signed)
Send BP readings via mychart on Wednesday. Will send phentermine refill after review of BP readings. Contact pulmonology for additional symbicort and benzonatate refill. F/up in 65month for weight management and HTN

## 2019-02-24 NOTE — Progress Notes (Signed)
Virtual Visit via Video Note  I connected with Emily Salazar on 02/24/19 at  9:30 AM EDT by a video enabled telemedicine application and verified that I am speaking with the correct person using two identifiers.  Location: Patient: Home Provider: Office   I discussed the limitations of evaluation and management by telemedicine and the availability of in person appointments. The patient expressed understanding and agreed to proceed.  AJ:OINOMV loss,symbicort,benzonatate consult (med consult/refills?)--cant get in touch with lung doc.  History of Present Illness:  Weight management: Use of phentermine every other day. Unable to exercise due to gym closure during pandemic. Has maintain low carb and low fat diet. 171lbs weight gain noted since 11/2018. Wt Readings from Last 3 Encounters:  02/24/19 237 lb 4.8 oz (107.6 kg)  12/02/18 228 lb 3.2 oz (103.5 kg)  11/29/18 220 lb (99.8 kg)     Observations/Objective: Physical Exam  Constitutional: She is oriented to person, place, and time. No distress.  Pulmonary/Chest: Effort normal.  Neurological: She is alert and oriented to person, place, and time.  Psychiatric: She has a normal mood and affect. Her behavior is normal.    Assessment and Plan: Emily Salazar was seen today for weight loss.  Diagnoses and all orders for this visit:  Essential hypertension  Encounter for weight management  Class 3 severe obesity due to excess calories with serious comorbidity and body mass index (BMI) of 40.0 to 44.9 in adult [facility])  Allergic rhinitis, unspecified seasonality, unspecified trigger -     cetirizine (ZYRTEC) 10 MG tablet; Take 1 tablet (10 mg total) by mouth at bedtime.  Moderate persistent asthma without complication -     budesonide-formoterol (SYMBICORT) 160-4.5 MCG/ACT inhaler; Inhale 2 puffs into the lungs 2 (two) times daily. -     benzonatate (TESSALON) 100 MG capsule; Take 1 capsule (100 mg total) by mouth 3 (three) times daily. -      cetirizine (ZYRTEC) 10 MG tablet; Take 1 tablet (10 mg total) by mouth at bedtime.   Follow Up Instructions: Send BP readings via mychart on Wednesday. Will send phentermine refill after review of BP readings. Contact pulmonology for additional symbicort and benzonatate refill. F/up in 11month for weight management and HTN   I discussed the assessment and treatment plan with the patient. The patient was provided an opportunity to ask questions and all were answered. The patient agreed with the plan and demonstrated an understanding of the instructions.   The patient was advised to call back or seek an in-person evaluation if the symptoms worsen or if the condition fails to improve as anticipated.   Emily Penna, NP

## 2019-02-24 NOTE — Assessment & Plan Note (Signed)
Unable to continue belviq due to medication recall. Phentermine 15 mg use for last 55months, no weight loss noted. Gained 17lbs in last 53months. Unable to provide BP readings today. She is to call office with BP readings, advised to resume exercise with home videos and walking daily to help with promote weight loss. Will re evaluate BP readings prior to continuing phentermine 37.5mg  She is send BP reading via mychart in 2days. F/up in office in 102month

## 2019-02-26 ENCOUNTER — Encounter: Payer: Self-pay | Admitting: Nurse Practitioner

## 2019-02-28 ENCOUNTER — Encounter: Payer: Self-pay | Admitting: Nurse Practitioner

## 2019-07-11 IMAGING — DX DG CHEST 2V
2 series · 2 of 2 positions shown · non-contrast
Comparison: 03/01/2018

CLINICAL DATA: Cough, short of breath and chest pain

EXAM:
CHEST - 2 VIEW

[chest pa]
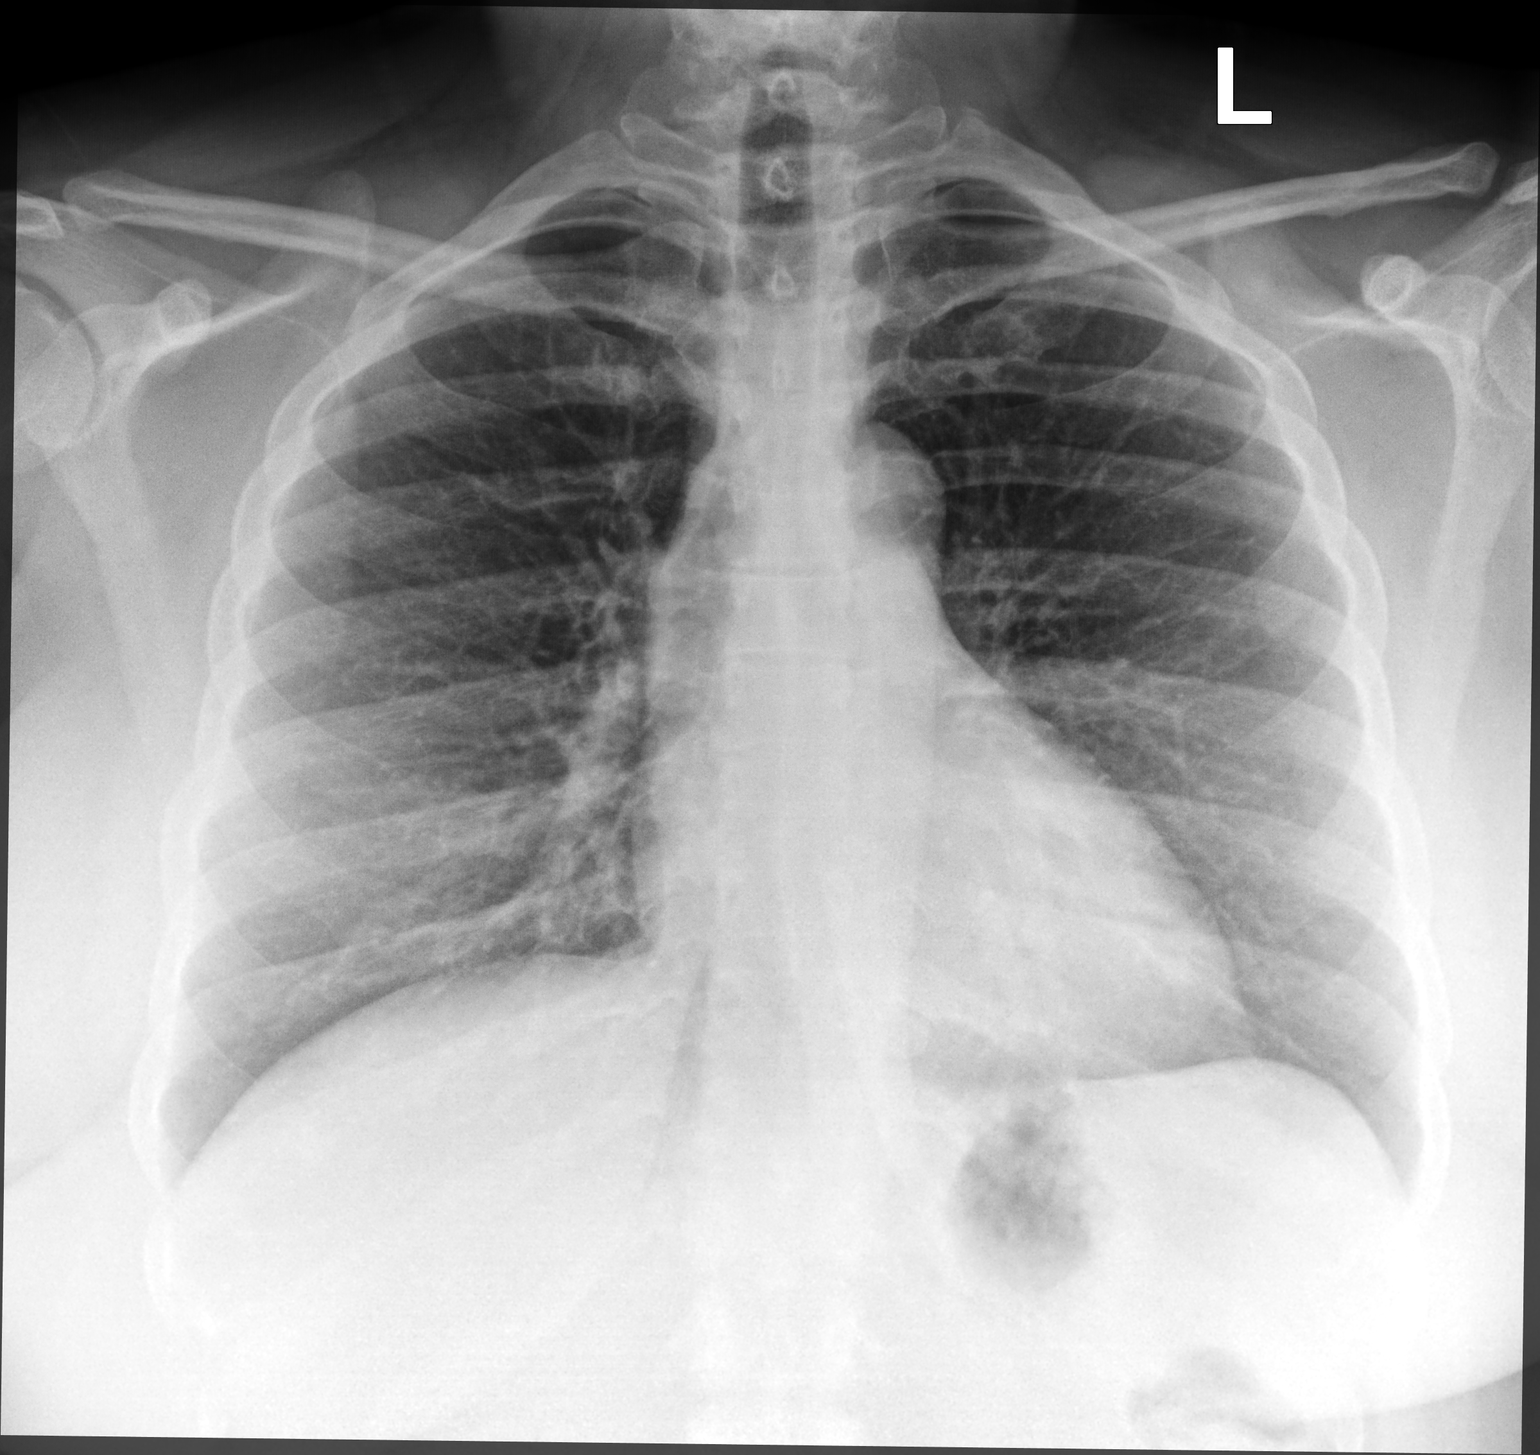

[chest lat]
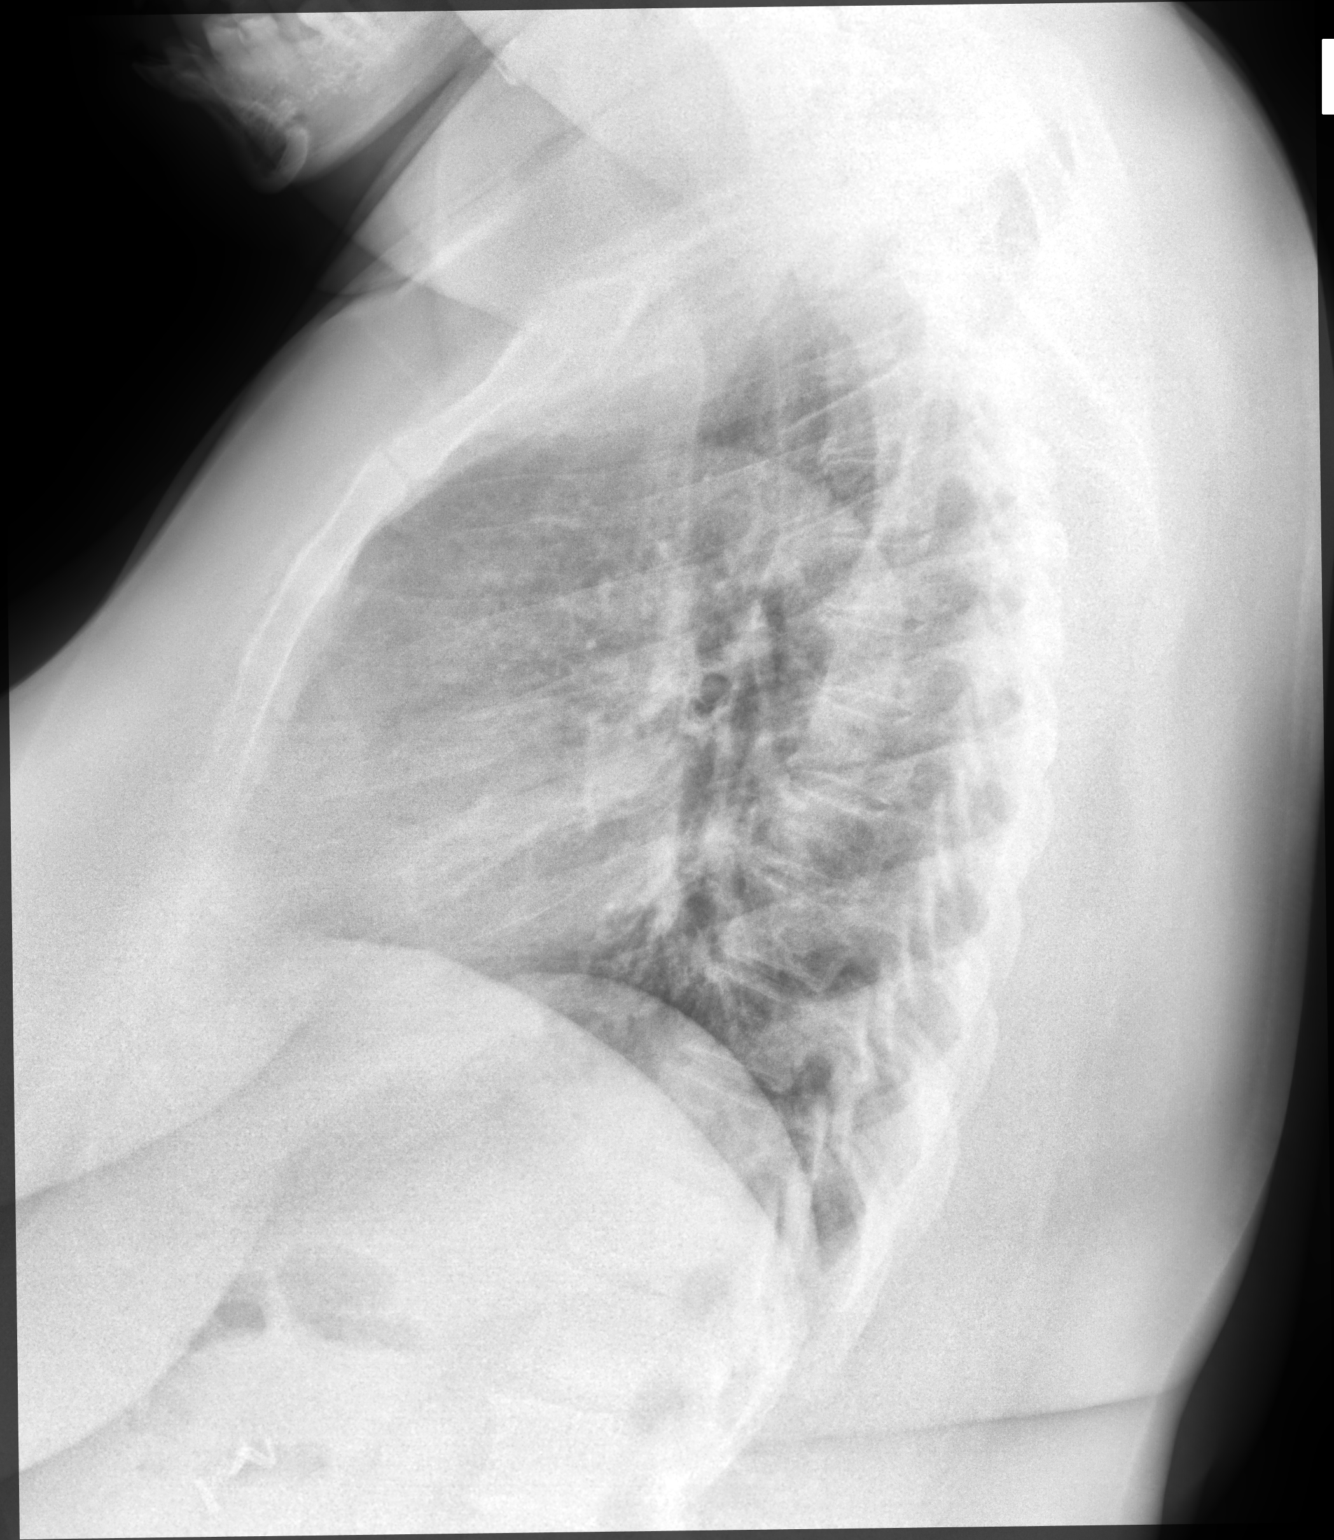

[2 of 2 positions shown; findings below may reference images not displayed]

FINDINGS: The heart size and mediastinal contours are within normal limits.
Both lungs are clear. The visualized skeletal structures are
unremarkable.
IMPRESSION: No active cardiopulmonary disease.

## 2019-09-09 ENCOUNTER — Telehealth: Payer: Self-pay | Admitting: Nurse Practitioner

## 2019-09-09 NOTE — Telephone Encounter (Signed)
Pt request refill for gabapentin 300 mg taking TID send to [facility] in [place].   Last ov for this was 11/2018---please advise.  Last refill was 11/2018--90 tab with 2 refill (pt is taking 1 tab TID)  LVM for the pt to call back.

## 2019-09-09 NOTE — Telephone Encounter (Signed)
Patient is returning a phet's call. Call back [phone]

## 2019-09-09 NOTE — Telephone Encounter (Signed)
Spoke with the pt, she moved to [place] due to job--pt est with new PCP now, do not need refill for this med--  Emily Salazar--
